# Patient Record
Sex: Female | Born: 1985
Health system: Southern US, Community
[De-identification: ages and names within clinical notes are randomized; demographics above are authoritative.]

## PROBLEM LIST (undated history)

## (undated) DIAGNOSIS — I10 Essential (primary) hypertension: Secondary | ICD-10-CM

## (undated) DIAGNOSIS — J45909 Unspecified asthma, uncomplicated: Secondary | ICD-10-CM

## (undated) DIAGNOSIS — E7800 Pure hypercholesterolemia, unspecified: Secondary | ICD-10-CM

## (undated) DIAGNOSIS — N83209 Unspecified ovarian cyst, unspecified side: Secondary | ICD-10-CM

## (undated) DIAGNOSIS — K219 Gastro-esophageal reflux disease without esophagitis: Secondary | ICD-10-CM

## (undated) DIAGNOSIS — I341 Nonrheumatic mitral (valve) prolapse: Secondary | ICD-10-CM

---

## 2014-12-24 DIAGNOSIS — F410 Panic disorder [episodic paroxysmal anxiety] without agoraphobia: Secondary | ICD-10-CM | POA: Insufficient documentation

## 2014-12-24 DIAGNOSIS — I341 Nonrheumatic mitral (valve) prolapse: Secondary | ICD-10-CM | POA: Insufficient documentation

## 2015-01-13 ENCOUNTER — Encounter (HOSPITAL_BASED_OUTPATIENT_CLINIC_OR_DEPARTMENT_OTHER): Payer: Self-pay | Admitting: Emergency Medicine

## 2015-01-13 ENCOUNTER — Emergency Department (HOSPITAL_BASED_OUTPATIENT_CLINIC_OR_DEPARTMENT_OTHER): Payer: BLUE CROSS/BLUE SHIELD

## 2015-01-13 ENCOUNTER — Emergency Department (HOSPITAL_BASED_OUTPATIENT_CLINIC_OR_DEPARTMENT_OTHER)
Admission: EM | Admit: 2015-01-13 | Discharge: 2015-01-13 | Disposition: A | Discharge status: Home / Self Care | Payer: BLUE CROSS/BLUE SHIELD | Attending: Emergency Medicine | Admitting: Emergency Medicine

## 2015-01-13 DIAGNOSIS — Y9289 Other specified places as the place of occurrence of the external cause: Secondary | ICD-10-CM | POA: Insufficient documentation

## 2015-01-13 DIAGNOSIS — Z79899 Other long term (current) drug therapy: Secondary | ICD-10-CM | POA: Insufficient documentation

## 2015-01-13 DIAGNOSIS — Y998 Other external cause status: Secondary | ICD-10-CM | POA: Insufficient documentation

## 2015-01-13 DIAGNOSIS — Y9389 Activity, other specified: Secondary | ICD-10-CM | POA: Insufficient documentation

## 2015-01-13 DIAGNOSIS — S8992XA Unspecified injury of left lower leg, initial encounter: Secondary | ICD-10-CM | POA: Diagnosis present

## 2015-01-13 DIAGNOSIS — S8002XA Contusion of left knee, initial encounter: Secondary | ICD-10-CM

## 2015-01-13 DIAGNOSIS — X58XXXA Exposure to other specified factors, initial encounter: Secondary | ICD-10-CM | POA: Insufficient documentation

## 2015-01-13 DIAGNOSIS — Z8679 Personal history of other diseases of the circulatory system: Secondary | ICD-10-CM | POA: Insufficient documentation

## 2015-01-13 HISTORY — DX: Nonrheumatic mitral (valve) prolapse: I34.1

## 2015-01-13 MED ORDER — HYDROCODONE-ACETAMINOPHEN 5-325 MG PO TABS: 1.0000 | ORAL_TABLET | ORAL | Status: DC | PRN

## 2015-01-13 MED ORDER — HYDROCODONE-ACETAMINOPHEN 5-325 MG PO TABS
1.0000 | ORAL_TABLET | Freq: Once | ORAL | Status: AC
  Administered 2015-01-13: 1 via ORAL
  Filled 2015-01-13: qty 1

## 2015-01-13 NOTE — Discharge Instructions (Signed)

## 2015-01-13 NOTE — ED Notes (Signed)
Pt twist knee moving furniture down steps no deformity noted

## 2015-01-13 NOTE — ED Notes (Signed)
[facility]. Pt alert, NAD, calm, interactive. C/o L knee pain after twisting knee, no meds PTA. denies other sx or injuries.

## 2015-01-13 NOTE — ED Provider Notes (Signed)
CSN: [id number]     Arrival date & time 01/13/15  2125 History   First MD Initiated Contact with Patient 01/13/15 2234     Chief Complaint  Patient presents with  . Knee Injury     (Consider location/radiation/quality/duration/timing/severity/associated sxs/prior Treatment) The history is provided by the patient.   patient presents with a left knee injury. States she was moving and stepped wrong on a stair. Began to fell forward and her lower leg twisted and she landed onto her knee. No other injuries. She did hit her head but no headache. No chest or abdominal pain. States she has had some pain walking on it since.  Past Medical History  Diagnosis Date  . Mitral valve prolapse    History reviewed. No pertinent past surgical history. History reviewed. No pertinent family history. History  Substance Use Topics  . Smoking status: Never Smoker   . Smokeless tobacco: Never Used  . Alcohol Use: No   OB History    No data available     Review of Systems  Constitutional: Negative for appetite change.  Respiratory: Negative for shortness of breath.   Gastrointestinal: Negative for abdominal pain.  Musculoskeletal: Positive for gait problem. Negative for back pain, joint swelling and neck pain.  Skin: Negative for wound.  Neurological: Negative for tremors and numbness.      Allergies  Codeine  Home Medications   Prior to Admission medications   Medication Sig Start Date End Date Taking? Authorizing Provider  busPIRone (BUSPAR) 15 MG tablet Take 15 mg by mouth 2 (two) times daily.   Yes Historical Provider, MD  HYDROcodone-acetaminophen (NORCO/VICODIN) 5-325 MG per tablet Take 1-2 tablets by mouth every 4 (four) hours as needed. 01/13/15   Juliet RudeNathan R. Ezekeil Bethel, MD   BP 106/69 mmHg  Pulse 78  Temp(Src) 98.1 F (36.7 C) (Oral)  Resp 18  SpO2 100%  LMP 01/06/2015 Physical Exam  Constitutional: She appears well-developed.  Cardiovascular: Normal rate and regular rhythm.    Pulmonary/Chest: She has no rales.  Abdominal: There is no tenderness.  Musculoskeletal: She exhibits tenderness.  Left knee tenderness laterally. Pain with flexion and extension. Pain with abduction and adduction at knee.   Skin: Skin is warm.    ED Course  Procedures (including critical care time) Labs Review Labs Reviewed - No data to display  Imaging Review Dg Knee Complete 4 Views Left  01/13/2015   CLINICAL DATA:  Twisting injury to left knee while moving furniture down steps. Left knee pain. Initial encounter.  EXAM: LEFT KNEE - COMPLETE 4+ VIEW  COMPARISON:  None.  FINDINGS: There is no evidence of fracture or dislocation. The joint spaces are preserved. No significant degenerative change is seen; the patellofemoral joint is grossly unremarkable in appearance. A fabella is noted.  No significant joint effusion is seen. The visualized soft tissues are normal in appearance.  IMPRESSION: No evidence of fracture or dislocation.   Electronically Signed   By: Roanna RaiderJeffery  Chang M.D.   On: 01/13/2015 23:03     EKG Interpretation None      MDM   Final diagnoses:  Knee contusion, left, initial encounter    Pain with likely internal derangement of left knee. Negative x-ray. Has pain. Will discharge home. [place]    Obediah Welles R. Rubin PayorPickering, MD 01/13/15 2328

## 2015-01-16 NOTE — ED Notes (Signed)
Patient called and stated that she can not schedule a follow up with Ortho, due to her medical insurance and PCP needs to make referral.  States she does not see the PCP listed on the card, and they are not taking new patients.  States she was initially offered to see Dr. Pearletha ForgeHudnall, and now would like to have a referral.  Info given for Dr. Pearletha ForgeHudnall, and encouraged to call Medicaid if she continues to have a problem with follow up.

## 2015-01-17 ENCOUNTER — Encounter: Payer: Self-pay | Admitting: Family Medicine

## 2015-01-17 ENCOUNTER — Ambulatory Visit (INDEPENDENT_AMBULATORY_CARE_PROVIDER_SITE_OTHER): Payer: BLUE CROSS/BLUE SHIELD | Admitting: Family Medicine

## 2015-01-17 VITALS — BP 128/74 | Ht 60.0 in | Wt 129.0 lb

## 2015-01-17 DIAGNOSIS — S8992XA Unspecified injury of left lower leg, initial encounter: Secondary | ICD-10-CM

## 2015-01-17 NOTE — Patient Instructions (Signed)
You have a patellar subluxation (near dislocation). Wear immobilizer when up and walking around - try to keep knee straight at all times for next 2 weeks. Ok to take this off to ice the area 15 minutes at a time 3-4 times a day. Ibuprofen or aleve as needed for pain and inflammation. Ok to take tylenol in addition to this. Do straight leg raises, quad flexion exercises I showed you 3 sets of 10 once a day - add ankle weight if these become too easy. Out of work for 2 weeks. Follow up with me in 2 weeks.

## 2015-01-22 DIAGNOSIS — F419 Anxiety disorder, unspecified: Secondary | ICD-10-CM | POA: Insufficient documentation

## 2015-01-22 DIAGNOSIS — J45909 Unspecified asthma, uncomplicated: Secondary | ICD-10-CM | POA: Insufficient documentation

## 2015-01-22 DIAGNOSIS — J302 Other seasonal allergic rhinitis: Secondary | ICD-10-CM | POA: Insufficient documentation

## 2015-01-22 DIAGNOSIS — S8992XA Unspecified injury of left lower leg, initial encounter: Secondary | ICD-10-CM | POA: Insufficient documentation

## 2015-01-22 NOTE — Progress Notes (Signed)
PCP: Agustina CaroliHICKS, KRISTIN D, MD  Subjective:   HPI: Patient is a 29 y.o. female here for left knee pain.  Patient reports on Sunday she was coming down stairs (in process of moving) when she twisted her left knee to the outside and bent behind her. Then landed onto left knee. Immediate pain mostly behind kneecap. Radiographs negative for fracture. No catching, locking. No prior injuries to this knee. Some swelling.   Past Medical History  Diagnosis Date  . Mitral valve prolapse     Current Outpatient Prescriptions on File Prior to Visit  Medication Sig Dispense Refill  . HYDROcodone-acetaminophen (NORCO/VICODIN) 5-325 MG per tablet Take 1-2 tablets by mouth every 4 (four) hours as needed. 8 tablet 0   No current facility-administered medications on file prior to visit.    No past surgical history on file.  Allergies  Allergen Reactions  . Latex Swelling and Hives  . Codeine   . Escitalopram Rash    Nausea, decreased appetite, felt like a zombie    History   Social History  . Marital Status: Single    Spouse Name: N/A  . Number of Children: N/A  . Years of Education: N/A   Occupational History  . Not on file.   Social History Main Topics  . Smoking status: Never Smoker   . Smokeless tobacco: Never Used  . Alcohol Use: No  . Drug Use: No  . Sexual Activity: Not on file   Other Topics Concern  . Not on file   Social History Narrative    No family history on file.  BP 128/74 mmHg  Ht 5' (1.524 m)  Wt 129 lb (58.514 kg)  BMI 25.19 kg/m2  LMP 01/06/2015  Review of Systems: See HPI above.    Objective:  Physical Exam:  Gen: NAD  Left knee: No gross deformity, ecchymoses, effusion. TTP post patellar facets.  No joing line tenderness. FROM. Negative ant/post drawers. Negative valgus/varus testing. Negative lachmanns. Negative mcmurrays, apleys.  Mild positive patellar apprehension. NV intact distally.    Assessment & Plan:  1. Left knee injury -  consistent with patellar subluxation though no dislocation.  Radiographs reassuring.  Other tests negative on exam.  Icing, nsaids, immobilizer next 2-3 weeks.  Start home quad strengthening exercises which were reviewed today.  Out of work for next 2 weeks - f/u at that time.

## 2015-01-22 NOTE — Assessment & Plan Note (Signed)
consistent with patellar subluxation though no dislocation.  Radiographs reassuring.  Other tests negative on exam.  Icing, nsaids, immobilizer next 2-3 weeks.  Start home quad strengthening exercises which were reviewed today.  Out of work for next 2 weeks - f/u at that time.

## 2015-01-24 ENCOUNTER — Telehealth: Payer: Self-pay | Admitting: Family Medicine

## 2015-01-24 NOTE — Telephone Encounter (Signed)
I think we need to see her back then - I'd squeeze her in the schedule tomorrow if she can come in then.

## 2015-01-25 ENCOUNTER — Encounter: Payer: Self-pay | Admitting: Family Medicine

## 2015-01-25 ENCOUNTER — Ambulatory Visit (INDEPENDENT_AMBULATORY_CARE_PROVIDER_SITE_OTHER): Payer: BLUE CROSS/BLUE SHIELD | Admitting: Family Medicine

## 2015-01-25 VITALS — BP 123/82 | HR 88 | Ht 60.0 in | Wt 129.0 lb

## 2015-01-25 DIAGNOSIS — S8992XD Unspecified injury of left lower leg, subsequent encounter: Secondary | ICD-10-CM

## 2015-01-25 NOTE — Patient Instructions (Signed)
Wear knee brace when up and walking around for next 4-5 weeks. Icing, tylenol and/or ibuprofen if needed. Consider physical therapy in future if needed. Follow up with me in 5 weeks or as needed.

## 2015-01-29 NOTE — Assessment & Plan Note (Signed)
consistent with patellar subluxation though no dislocation.  Has improved tremendously over the past week.  Switch to a knee brace and wear this for next 5 weeks when up and walking around.  Tylenol/nsaids, icing only if needed.  Continue home exercises for 5 weeks.  Consider physical therapy if not improving.  F/u prn.  Return to work without restrictions otherwise.

## 2015-01-29 NOTE — Progress Notes (Signed)
PCP: Agustina CaroliHICKS, KRISTIN D, MD  Subjective:   HPI: Patient is a 29 y.o. female here for left knee pain.  3/3: Patient reports on Sunday she was coming down stairs (in process of moving) when she twisted her left knee to the outside and bent behind her. Then landed onto left knee. Immediate pain mostly behind kneecap. Radiographs negative for fracture. No catching, locking. No prior injuries to this knee. Some swelling.   3/11: Patient feels much better since last visit. Has been out of work. Stopped using immobilizer. Doing home exercises as directed. Not taking anything for pain now.  Past Medical History  Diagnosis Date  . Mitral valve prolapse     Current Outpatient Prescriptions on File Prior to Visit  Medication Sig Dispense Refill  . albuterol (PROVENTIL) (5 MG/ML) 0.5% nebulizer solution Inhale by nebulization. Dilute 0.9025ml=1.25mg  to final volume of 3ml for inhalation every 3-4 hours as needed for wheezing    . busPIRone (BUSPAR) 15 MG tablet Take by mouth one half tab twice a day for 7 days, then increase to one whole tab twice a day-anxiety    . diclofenac (CATAFLAM) 50 MG tablet Take 50 mg by mouth.    Marland Kitchen. HYDROcodone-acetaminophen (NORCO/VICODIN) 5-325 MG per tablet Take 1-2 tablets by mouth every 4 (four) hours as needed. 8 tablet 0  . norethindrone (MICRONOR,CAMILA,ERRIN) 0.35 MG tablet Take by mouth.    . rizatriptan (MAXALT) 10 MG tablet Take 1 tablet at onset of headache. May repeat one time in 2 hours if needed. Max of 2/24 hours.    . sertraline (ZOLOFT) 25 MG tablet Take 50 mg by mouth.     No current facility-administered medications on file prior to visit.    No past surgical history on file.  Allergies  Allergen Reactions  . Latex Swelling and Hives  . Codeine   . Escitalopram Rash    Nausea, decreased appetite, felt like a zombie    History   Social History  . Marital Status: Single    Spouse Name: N/A  . Number of Children: N/A  . Years of  Education: N/A   Occupational History  . Not on file.   Social History Main Topics  . Smoking status: Never Smoker   . Smokeless tobacco: Never Used  . Alcohol Use: No  . Drug Use: No  . Sexual Activity: Not on file   Other Topics Concern  . Not on file   Social History Narrative    No family history on file.  BP 123/82 mmHg  Pulse 88  Ht 5' (1.524 m)  Wt 129 lb (58.514 kg)  BMI 25.19 kg/m2  LMP 01/06/2015  Review of Systems: See HPI above.    Objective:  Physical Exam:  Gen: NAD  Left knee: No gross deformity, ecchymoses, effusion. No TTP post patellar facets.  No joint line tenderness. FROM. Negative ant/post drawers. Negative valgus/varus testing. Negative lachmanns. Negative mcmurrays, apleys. Negative patellar apprehension. NV intact distally.    Assessment & Plan:  1. Left knee injury - consistent with patellar subluxation though no dislocation.  Has improved tremendously over the past week.  Switch to a knee brace and wear this for next 5 weeks when up and walking around.  Tylenol/nsaids, icing only if needed.  Continue home exercises for 5 weeks.  Consider physical therapy if not improving.  F/u prn.  Return to work without restrictions otherwise.

## 2015-01-30 ENCOUNTER — Ambulatory Visit: Payer: BLUE CROSS/BLUE SHIELD | Admitting: Family Medicine

## 2015-02-02 ENCOUNTER — Emergency Department (HOSPITAL_BASED_OUTPATIENT_CLINIC_OR_DEPARTMENT_OTHER)
Admission: EM | Admit: 2015-02-02 | Discharge: 2015-02-03 | Disposition: A | Discharge status: Home / Self Care | Payer: BLUE CROSS/BLUE SHIELD | Attending: Emergency Medicine | Admitting: Emergency Medicine

## 2015-02-02 ENCOUNTER — Encounter (HOSPITAL_BASED_OUTPATIENT_CLINIC_OR_DEPARTMENT_OTHER): Payer: Self-pay | Admitting: Emergency Medicine

## 2015-02-02 DIAGNOSIS — Z79899 Other long term (current) drug therapy: Secondary | ICD-10-CM | POA: Diagnosis not present

## 2015-02-02 DIAGNOSIS — M549 Dorsalgia, unspecified: Secondary | ICD-10-CM | POA: Insufficient documentation

## 2015-02-02 DIAGNOSIS — R141 Gas pain: Secondary | ICD-10-CM | POA: Diagnosis not present

## 2015-02-02 DIAGNOSIS — R110 Nausea: Secondary | ICD-10-CM | POA: Insufficient documentation

## 2015-02-02 DIAGNOSIS — Z3202 Encounter for pregnancy test, result negative: Secondary | ICD-10-CM | POA: Diagnosis not present

## 2015-02-02 DIAGNOSIS — R1084 Generalized abdominal pain: Secondary | ICD-10-CM | POA: Insufficient documentation

## 2015-02-02 DIAGNOSIS — R52 Pain, unspecified: Secondary | ICD-10-CM

## 2015-02-02 DIAGNOSIS — M791 Myalgia, unspecified site: Secondary | ICD-10-CM

## 2015-02-02 DIAGNOSIS — Z791 Long term (current) use of non-steroidal anti-inflammatories (NSAID): Secondary | ICD-10-CM | POA: Diagnosis not present

## 2015-02-02 DIAGNOSIS — IMO0001 Reserved for inherently not codable concepts without codable children: Secondary | ICD-10-CM

## 2015-02-02 LAB — URINALYSIS, ROUTINE W REFLEX MICROSCOPIC
Glucose, UA: NEGATIVE mg/dL
HGB URINE DIPSTICK: NEGATIVE
Ketones, ur: NEGATIVE mg/dL
Leukocytes, UA: NEGATIVE
Nitrite: NEGATIVE
PROTEIN: NEGATIVE mg/dL
SPECIFIC GRAVITY, URINE: 1.026 (ref 1.005–1.030)
Urobilinogen, UA: 2 mg/dL — ABNORMAL HIGH (ref 0.0–1.0)
pH: 6.5 (ref 5.0–8.0)

## 2015-02-02 LAB — PREGNANCY, URINE: PREG TEST UR: NEGATIVE

## 2015-02-02 MED ORDER — KETOROLAC TROMETHAMINE 30 MG/ML IJ SOLN
30.0000 mg | Freq: Once | INTRAMUSCULAR | Status: AC
  Administered 2015-02-03: 30 mg via INTRAVENOUS
  Filled 2015-02-02: qty 1

## 2015-02-02 MED ORDER — DICYCLOMINE HCL 10 MG/ML IM SOLN
20.0000 mg | Freq: Once | INTRAMUSCULAR | Status: AC
  Administered 2015-02-03: 20 mg via INTRAMUSCULAR
  Filled 2015-02-02: qty 2

## 2015-02-02 NOTE — ED Notes (Signed)
Pt reports severe abd pain onset today @ 1600 reports Hx ovarian cycst

## 2015-02-02 NOTE — ED Provider Notes (Signed)
CSN: [id number]     Arrival date & time 02/02/15  2006 History  This chart was scribed for Elida Harbin, MD by Tonye RoyaltyJoshua Chen, ED Scribe. This patient was seen in room MH11/MH11 and the patient's care was started at 11:03 PM.    Chief Complaint  Patient presents with  . Abdominal Pain   Patient is a 29 y.o. female presenting with abdominal pain. The history is provided by the patient. No language interpreter was used.  Abdominal Pain Pain location:  Generalized Pain quality: stabbing   Pain radiation: low back. Pain severity:  Moderate Onset quality:  Sudden Duration:  1 day Timing:  Constant Progression:  Unchanged Chronicity:  New Context: sick contacts   Context: not suspicious food intake and not trauma   Relieved by:  None tried Worsened by:  Nothing tried Ineffective treatments:  None tried Associated symptoms: nausea   Associated symptoms: no constipation, no cough, no diarrhea, no dysuria, no vaginal bleeding, no vaginal discharge and no vomiting   Risk factors: no alcohol abuse and not pregnant     HPI Comments: Linda SitesKristina Archer is a 29 y.o. female with history of ovarian cyst who presents to the Emergency Department complaining of constant, stabbing low back pain radiating to front with onset this morning. She reports associated nausea and smelly, dark urine. Her daughter has stomach flu. She states her menstrual cycle just ended. She denies atypical bleeding or discharge. No BM today. She denies diarrhea, constitpation, vomiting, dysuria, frequency, cough, congestion, rash. She denies any new medications.  Past Medical History  Diagnosis Date  . Mitral valve prolapse    History reviewed. No pertinent past surgical history. History reviewed. No pertinent family history. History  Substance Use Topics  . Smoking status: Never Smoker   . Smokeless tobacco: Never Used  . Alcohol Use: No   OB History    No data available     Review of Systems  HENT: Negative for  congestion.   Respiratory: Negative for cough.   Gastrointestinal: Positive for nausea and abdominal pain. Negative for vomiting, diarrhea and constipation.  Genitourinary: Negative for dysuria, frequency, vaginal bleeding and vaginal discharge.  Musculoskeletal: Positive for back pain.  Skin: Negative for rash.  All other systems reviewed and are negative.     Allergies  Latex; Codeine; and Escitalopram  Home Medications   Prior to Admission medications   Medication Sig Start Date End Date Taking? Authorizing Provider  albuterol (PROVENTIL) (5 MG/ML) 0.5% nebulizer solution Inhale by nebulization. Dilute 0.8425ml=1.25mg  to final volume of 3ml for inhalation every 3-4 hours as needed for wheezing    Historical Provider, MD  busPIRone (BUSPAR) 15 MG tablet Take by mouth one half tab twice a day for 7 days, then increase to one whole tab twice a day-anxiety 01/08/15 01/09/16  Historical Provider, MD  diclofenac (CATAFLAM) 50 MG tablet Take 50 mg by mouth. 06/19/14   Historical Provider, MD  HYDROcodone-acetaminophen (NORCO/VICODIN) 5-325 MG per tablet Take 1-2 tablets by mouth every 4 (four) hours as needed. 01/13/15   Benjiman CoreNathan Pickering, MD  norethindrone (MICRONOR,CAMILA,ERRIN) 0.35 MG tablet Take by mouth.    Historical Provider, MD  rizatriptan (MAXALT) 10 MG tablet Take 1 tablet at onset of headache. May repeat one time in 2 hours if needed. Max of 2/24 hours. 10/15/14   Historical Provider, MD  sertraline (ZOLOFT) 25 MG tablet Take 50 mg by mouth.    Historical Provider, MD   BP 109/71 mmHg  Pulse 81  Temp(Src)  99.3 F (37.4 C) (Oral)  Resp 24  Ht 5' (1.524 m)  Wt 129 lb (58.514 kg)  BMI 25.19 kg/m2  SpO2 100%  LMP 01/03/2015 Physical Exam  Constitutional: She is oriented to person, place, and time. She appears well-developed and well-nourished. No distress.  HENT:  Head: Normocephalic and atraumatic.  Mouth/Throat: Oropharynx is clear and moist. No oropharyngeal exudate.  Moist  mucous membranes  Eyes: Conjunctivae and EOM are normal. Pupils are equal, round, and reactive to light.  Neck: Normal range of motion. Neck supple. No tracheal deviation present.  Cardiovascular: Normal rate, regular rhythm and normal heart sounds.   No murmur heard. Pulmonary/Chest: Effort normal and breath sounds normal. No respiratory distress. She has no wheezes. She has no rales.  Abdominal: Soft. She exhibits no distension. There is no tenderness. There is no rigidity, no rebound, no guarding, no tenderness at McBurney's point and negative Murphy's sign.  Hyperactive bowel sounds reactive   Musculoskeletal: Normal range of motion. She exhibits no edema.  Neurological: She is alert and oriented to person, place, and time.  Skin: Skin is warm and dry. She is not diaphoretic.  Psychiatric: She has a normal mood and affect.  Nursing note and vitals reviewed.   ED Course  Procedures (including critical care time)  DIAGNOSTIC STUDIES: Oxygen Saturation is 100% on room air, normal by my interpretation.    COORDINATION OF CARE: 11:07 PM Discussed treatment plan with patient at beside, the patient agrees with the plan and has no further questions at this time.   Labs Review Labs Reviewed - No data to display  Imaging Review No results found.   EKG Interpretation None      MDM   Final diagnoses:  None    Likely muscle spasm of the lower back and also has increased gas and spasm.  She has no white count benign exam and negative CT scan will treat for gas and cramping and MSK pain.  Strict return precautions given  I personally performed the services described in this documentation, which was scribed in my presence. The recorded information has been reviewed and is accurate.    Cy Blamer, MD 02/03/15 [phone]

## 2015-02-03 ENCOUNTER — Encounter (HOSPITAL_BASED_OUTPATIENT_CLINIC_OR_DEPARTMENT_OTHER): Payer: Self-pay | Admitting: Emergency Medicine

## 2015-02-03 ENCOUNTER — Emergency Department (HOSPITAL_BASED_OUTPATIENT_CLINIC_OR_DEPARTMENT_OTHER): Payer: BLUE CROSS/BLUE SHIELD

## 2015-02-03 DIAGNOSIS — R1084 Generalized abdominal pain: Secondary | ICD-10-CM | POA: Diagnosis not present

## 2015-02-03 LAB — CBC WITH DIFFERENTIAL/PLATELET
BASOS PCT: 0 % (ref 0–1)
Basophils Absolute: 0 10*3/uL (ref 0.0–0.1)
EOS PCT: 1 % (ref 0–5)
Eosinophils Absolute: 0.1 10*3/uL (ref 0.0–0.7)
HCT: 40.9 % (ref 36.0–46.0)
HEMOGLOBIN: 13.4 g/dL (ref 12.0–15.0)
LYMPHS ABS: 0.7 10*3/uL (ref 0.7–4.0)
LYMPHS PCT: 12 % (ref 12–46)
MCH: 25.8 pg — ABNORMAL LOW (ref 26.0–34.0)
MCHC: 32.8 g/dL (ref 30.0–36.0)
MCV: 78.8 fL (ref 78.0–100.0)
MONOS PCT: 7 % (ref 3–12)
Monocytes Absolute: 0.4 10*3/uL (ref 0.1–1.0)
NEUTROS PCT: 80 % — AB (ref 43–77)
Neutro Abs: 4.6 10*3/uL (ref 1.7–7.7)
Platelets: 165 10*3/uL (ref 150–400)
RBC: 5.19 MIL/uL — AB (ref 3.87–5.11)
RDW: 13.7 % (ref 11.5–15.5)
WBC: 5.8 10*3/uL (ref 4.0–10.5)

## 2015-02-03 LAB — COMPREHENSIVE METABOLIC PANEL
ALBUMIN: 3.9 g/dL (ref 3.5–5.2)
ALK PHOS: 66 U/L (ref 39–117)
ALT: 9 U/L (ref 0–35)
ANION GAP: 7 (ref 5–15)
AST: 15 U/L (ref 0–37)
BILIRUBIN TOTAL: 0.8 mg/dL (ref 0.3–1.2)
BUN: 11 mg/dL (ref 6–23)
CO2: 25 mmol/L (ref 19–32)
CREATININE: 0.69 mg/dL (ref 0.50–1.10)
Calcium: 8.5 mg/dL (ref 8.4–10.5)
Chloride: 105 mmol/L (ref 96–112)
GFR calc Af Amer: >90 mL/min (ref >90)
GFR calc non Af Amer: >90 mL/min (ref >90)
Glucose, Bld: 96 mg/dL (ref 70–99)
Potassium: 3.3 mmol/L — ABNORMAL LOW (ref 3.5–5.1)
SODIUM: 137 mmol/L (ref 135–145)
TOTAL PROTEIN: 6.9 g/dL (ref 6.0–8.3)

## 2015-02-03 MED ORDER — MORPHINE SULFATE 4 MG/ML IJ SOLN
4.0000 mg | Freq: Once | INTRAMUSCULAR | Status: AC
  Administered 2015-02-03: 4 mg via INTRAVENOUS
  Filled 2015-02-03: qty 1

## 2015-02-03 MED ORDER — DICYCLOMINE HCL 20 MG PO TABS: 20.0000 mg | ORAL_TABLET | Freq: Two times a day (BID) | ORAL | Status: DC

## 2015-02-03 MED ORDER — GI COCKTAIL ~~LOC~~: 30.0000 mL | Freq: Once | ORAL | Status: DC

## 2015-02-03 MED ORDER — METHOCARBAMOL 500 MG PO TABS: 500.0000 mg | ORAL_TABLET | Freq: Two times a day (BID) | ORAL | Status: DC

## 2015-02-03 MED ORDER — DICLOFENAC POTASSIUM 50 MG PO TABS: 50.0000 mg | ORAL_TABLET | Freq: Three times a day (TID) | ORAL | Status: DC

## 2015-09-20 DIAGNOSIS — D696 Thrombocytopenia, unspecified: Secondary | ICD-10-CM | POA: Insufficient documentation

## 2015-12-13 ENCOUNTER — Ambulatory Visit (INDEPENDENT_AMBULATORY_CARE_PROVIDER_SITE_OTHER): Payer: Managed Care, Other (non HMO) | Admitting: Family Medicine

## 2015-12-13 DIAGNOSIS — S8992XD Unspecified injury of left lower leg, subsequent encounter: Secondary | ICD-10-CM

## 2015-12-13 MED ORDER — ACETAMINOPHEN-CODEINE #3 300-30 MG PO TABS
1.0000 | ORAL_TABLET | Freq: Four times a day (QID) | ORAL | Status: DC | PRN
Start: 2015-12-13 — End: 2015-12-31

## 2015-12-13 NOTE — Patient Instructions (Addendum)
You have subluxed your patella again but it doesn't appear that you've done damage to the knee itself. Ice the knee 15 minutes at a time 3-4 times a day. Wear knee brace when up and walking around until I see you back. Ibuprofen  three times a day OR aleve 2 tabs twice a day with food for pain and inflammation. Elevate above your heart as needed for swelling. Do simple motion exercises Tylenol #3 as needed for severe pain. Follow up with me in 2 weeks for reevaluation.

## 2015-12-17 NOTE — Assessment & Plan Note (Signed)
consistent with patellar subluxation though no dislocation.  Reassured patient.  Icing, knee brace, nsaids, elevation.  Tylenol #3 for severe pain.  F/u in 2 weeks for reevaluation.

## 2015-12-17 NOTE — Progress Notes (Signed)
PCP: Agustina Caroli, MD  Subjective:   HPI: Patient is a 30 y.o. female here for left knee injury.  3/3: Patient reports on Sunday she was coming down stairs (in process of moving) when she twisted her left knee to the outside and bent behind her. Then landed onto left knee. Immediate pain mostly behind kneecap. Radiographs negative for fracture. No catching, locking. No prior injuries to this knee. Some swelling.   01/25/15: Patient feels much better since last visit. Has been out of work. Stopped using immobilizer. Doing home exercises as directed. Not taking anything for pain now.  12/13/15: Patient reports on Saturday she was stepping through the net walk at [facility] when she felt her left knee shift a little bit. + swelling and bruising. Pain has improved down to 3/10 level now. Started wearing her knee brace again. Pain is dull, anterior. No skin changes, fever, other complaints.  Past Medical History  Diagnosis Date  . Mitral valve prolapse     Current Outpatient Prescriptions on File Prior to Visit  Medication Sig Dispense Refill  . albuterol (PROVENTIL) (5 MG/ML) 0.5% nebulizer solution Inhale by nebulization. Dilute 0.71ml=1.25mg  to final volume of 3ml for inhalation every 3-4 hours as needed for wheezing    . norethindrone (MICRONOR,CAMILA,ERRIN) 0.35 MG tablet Take by mouth.    . rizatriptan (MAXALT) 10 MG tablet Take 1 tablet at onset of headache. May repeat one time in 2 hours if needed. Max of 2/24 hours.     No current facility-administered medications on file prior to visit.    No past surgical history on file.  Allergies  Allergen Reactions  . Latex Swelling and Hives  . Codeine   . Escitalopram Rash    Nausea, decreased appetite, felt like a zombie    Social History   Social History  . Marital Status: Single    Spouse Name: N/A  . Number of Children: N/A  . Years of Education: N/A   Occupational History  . Not on file.   Social  History Main Topics  . Smoking status: Never Smoker   . Smokeless tobacco: Never Used  . Alcohol Use: No  . Drug Use: No  . Sexual Activity: Not on file   Other Topics Concern  . Not on file   Social History Narrative    No family history on file.  There were no vitals taken for this visit.  Review of Systems: See HPI above.    Objective:  Physical Exam:  Gen: NAD  Left knee: No gross deformity, ecchymoses, effusion. TTP post patellar facets.  No joint line tenderness. ROM 0-90 degrees. Negative ant/post drawers. Negative valgus/varus testing. Negative lachmanns. Negative mcmurrays, apleys. Mild pain patellar apprehension. NV intact distally.  Right knee: FROM without pain.    Assessment & Plan:  1. Left knee injury - consistent with patellar subluxation though no dislocation.  Reassured patient.  Icing, knee brace, nsaids, elevation.  Tylenol #3 for severe pain.  F/u in 2 weeks for reevaluation.

## 2015-12-27 ENCOUNTER — Encounter: Payer: Self-pay | Admitting: Family Medicine

## 2015-12-27 ENCOUNTER — Ambulatory Visit (INDEPENDENT_AMBULATORY_CARE_PROVIDER_SITE_OTHER): Payer: Medicaid Other | Admitting: Family Medicine

## 2015-12-27 VITALS — BP 121/77 | HR 83 | Ht 60.0 in | Wt 135.0 lb

## 2015-12-27 DIAGNOSIS — S8992XD Unspecified injury of left lower leg, subsequent encounter: Secondary | ICD-10-CM

## 2015-12-27 NOTE — Patient Instructions (Signed)
Start home exercises - knee extensions, straight leg raises, straight leg raises with foot turned outwards 3 sets of 10 once a day for 4 weeks. Add ankle weight if these become too easy. Follow up with me as needed.

## 2015-12-31 NOTE — Progress Notes (Signed)
PCP: Agustina Caroli, MD  Subjective:   HPI: Patient is a 30 y.o. female here for left knee injury.  3/3: Patient reports on Sunday she was coming down stairs (in process of moving) when she twisted her left knee to the outside and bent behind her. Then landed onto left knee. Immediate pain mostly behind kneecap. Radiographs negative for fracture. No catching, locking. No prior injuries to this knee. Some swelling.   01/25/15: Patient feels much better since last visit. Has been out of work. Stopped using immobilizer. Doing home exercises as directed. Not taking anything for pain now.  12/13/15: Patient reports on Saturday she was stepping through the net walk at [employer] when she felt her left knee shift a little bit. + swelling and bruising. Pain has improved down to 3/10 level now. Started wearing her knee brace again. Pain is dull, anterior. No skin changes, fever, other complaints.  2/10: Patient reports she feels significantly improved. Pain level down to a 1/10 ache anteriorly. No longer wearing brace, taking any medicine or icing. Not worse with any particular activities. No skin changes, fever, other complaints.  Past Medical History  Diagnosis Date  . Mitral valve prolapse     Current Outpatient Prescriptions on File Prior to Visit  Medication Sig Dispense Refill  . albuterol (PROVENTIL) (5 MG/ML) 0.5% nebulizer solution Inhale by nebulization. Dilute 0.63ml=1.25mg  to final volume of 3ml for inhalation every 3-4 hours as needed for wheezing    . norethindrone (MICRONOR,CAMILA,ERRIN) 0.35 MG tablet Take by mouth.    . rizatriptan (MAXALT) 10 MG tablet Take 1 tablet at onset of headache. May repeat one time in 2 hours if needed. Max of 2/24 hours.    . sertraline (ZOLOFT) 50 MG tablet Take I mouth 1 tablet daily for 7 days, then increase to 1-1/2 tablets daily-anxiety     No current facility-administered medications on file prior to visit.    No past  surgical history on file.  Allergies  Allergen Reactions  . Latex Swelling and Hives  . Codeine   . Escitalopram Rash    Nausea, decreased appetite, felt like a zombie    Social History   Social History  . Marital Status: Single    Spouse Name: N/A  . Number of Children: N/A  . Years of Education: N/A   Occupational History  . Not on file.   Social History Main Topics  . Smoking status: Never Smoker   . Smokeless tobacco: Never Used  . Alcohol Use: No  . Drug Use: No  . Sexual Activity: Not on file   Other Topics Concern  . Not on file   Social History Narrative    No family history on file.  BP 121/77 mmHg  Pulse 83  Ht 5' (1.524 m)  Wt 135 lb (61.236 kg)  BMI 26.37 kg/m2  Review of Systems: See HPI above.    Objective:  Physical Exam:  Gen: NAD  Left knee: No gross deformity, ecchymoses, effusion. No TTP post patellar facets.  No joint line tenderness. FROM Negative ant/post drawers. Negative valgus/varus testing. Negative lachmanns. Negative mcmurrays, apleys. Negative patellar apprehension. NV intact distally.  Right knee: FROM without pain.    Assessment & Plan:  1. Left knee injury - consistent with patellar subluxation though no dislocation.  Clinically improved at this point.  Shown home exercises to do daily.  Brace only if needed.  F/u prn.

## 2015-12-31 NOTE — Assessment & Plan Note (Signed)
consistent with patellar subluxation though no dislocation.  Clinically improved at this point.  Shown home exercises to do daily.  Brace only if needed.  F/u prn.

## 2016-06-18 ENCOUNTER — Emergency Department (HOSPITAL_BASED_OUTPATIENT_CLINIC_OR_DEPARTMENT_OTHER)
Admission: EM | Admit: 2016-06-18 | Discharge: 2016-06-18 | Disposition: A | Discharge status: Home / Self Care | Payer: Managed Care, Other (non HMO) | Attending: Physician Assistant | Admitting: Physician Assistant

## 2016-06-18 ENCOUNTER — Encounter (HOSPITAL_BASED_OUTPATIENT_CLINIC_OR_DEPARTMENT_OTHER): Payer: Self-pay | Admitting: Emergency Medicine

## 2016-06-18 DIAGNOSIS — I10 Essential (primary) hypertension: Secondary | ICD-10-CM | POA: Diagnosis not present

## 2016-06-18 DIAGNOSIS — G5601 Carpal tunnel syndrome, right upper limb: Secondary | ICD-10-CM | POA: Diagnosis not present

## 2016-06-18 DIAGNOSIS — M79641 Pain in right hand: Secondary | ICD-10-CM | POA: Diagnosis present

## 2016-06-18 DIAGNOSIS — Z79899 Other long term (current) drug therapy: Secondary | ICD-10-CM | POA: Diagnosis not present

## 2016-06-18 HISTORY — DX: Essential (primary) hypertension: I10

## 2016-06-18 HISTORY — DX: Gastro-esophageal reflux disease without esophagitis: K21.9

## 2016-06-18 HISTORY — DX: Pure hypercholesterolemia, unspecified: E78.00

## 2016-06-18 MED ORDER — IBUPROFEN 800 MG PO TABS: 800.0000 mg | ORAL_TABLET | Freq: Three times a day (TID) | ORAL | 0 refills | Status: DC

## 2016-06-18 NOTE — ED Triage Notes (Signed)
Right hand with mild swelling. Pt denies injury but states she constantly uses hands for typing. Skin intact as well as sensation. Reports difficulty squeezing fist. Hx of carpal tunnel in the same extremity.

## 2016-06-18 NOTE — ED Provider Notes (Signed)
[facility]   CSN: [id number] Arrival date & time: 06/18/16  0902  First Provider Contact:  None       History   Chief Complaint Chief Complaint  Patient presents with  . Hand Pain    HPI Linda Archer is a 30 y.o. female.  Patient with a history of Carpal Tunnel presents today with pain of her right hand and right wrist.  She reports that the pain has been present for the past month and gradually worsening.  No acute injury or trauma.  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  Pain is worse with typing.  She describes the pain as a shooting pain from her wrist to her 1st, 2nd, and 3rd fingers.  She also reports associated numbness of the 1st-3rd fingers.  She has taken Ibuprofen for the pain with temporary relief.  She denies fever, chills, or any other symptoms.        Past Medical History:  Diagnosis Date  . GERD (gastroesophageal reflux disease)   . Hypercholesteremia   . Hypertension   . Mitral valve prolapse     Patient Active Problem List   Diagnosis Date Noted  . Thrombocytopenia (HCC) 09/20/2015  . 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 Anxiety 01/22/2015  . Allergic rhinitis, seasonal 01/22/2015  . Left knee injury 01/22/2015  . Billowing mitral valve 12/24/2014  . 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 No pertinent surgical history.  OB History    Gravida Para Term Preterm AB Living   2 2       2    SAB TAB Ectopic Multiple Live Births                   Home Medications    Prior to Admission medications   Medication Sig Start Date End Date Taking? Authorizing Provider  albuterol (PROVENTIL) (5 MG/ML) 0.5% nebulizer solution Inhale by nebulization. Dilute 0.36ml=1.25mg  to final volume of 12ml for inhalation every 3-4 hours as needed for wheezing   Yes Historical Provider, MD  citalopram (CELEXA) 40 MG tablet Take 40 mg by mouth  daily.   Yes Historical Provider, MD  azithromycin (ZITHROMAX) 250 MG tablet  12/25/15   Historical Provider, MD  HYDROcodone-homatropine (HYCODAN) 5-1.5 MG/5ML syrup Take by mouth. 12/25/15   Historical Provider, MD  norethindrone (MICRONOR,CAMILA,ERRIN) 0.35 MG tablet Take by mouth.    Historical Provider, MD  rizatriptan (MAXALT) 10 MG tablet Take 1 tablet at onset of headache. May repeat one time in 2 hours if needed. Max of 2/24 hours. 10/15/14   Historical Provider, MD  sertraline (ZOLOFT) 50 MG tablet Take I mouth 1 tablet daily for 7 days, then increase to 1-1/2 tablets daily-anxiety 09/23/15 09/22/16  Historical Provider, MD    Family History No family history on file.  Social History Social History  Substance Use Topics  . Smoking status: Never Smoker  . Smokeless tobacco: Never Used  . Alcohol use 0.0 oz/week     Comment: occaisional      Allergies   Latex; Codeine; and Escitalopram   Review of Systems Review of Systems  All other systems reviewed and are negative.    Physical Exam Updated Vital Signs BP 122/85 (BP Location: Left Arm)   Pulse 76   Temp 98.1 F (36.7 C) (Oral)   Resp 18   Ht 5' (1.524 m)  Wt 65.8 kg   LMP 06/12/2016   SpO2 99%   BMI 28.32 kg/m   Physical Exam  Constitutional: She appears well-developed and well-nourished.  Neck: Normal range of motion. Neck supple.  Cardiovascular: Normal rate and regular rhythm.   Pulses:      Radial pulses are 2+ on the right side, and 2+ on the left side.  Pulmonary/Chest: Effort normal and breath sounds normal.  Musculoskeletal:  No erythema, edema, or warmth of the right wrist or right hand Normal ROM of the right hand and the right wrist. Positive Tinel's sign and Phalan's sign  Neurological: She is alert.  Distal sensation of the fingers of the right hand intact  Skin: Skin is warm and dry.  Psychiatric: She has a normal mood and affect.  Nursing note and vitals reviewed.    ED Treatments /  Results  Labs (all labs ordered are listed, but only abnormal results are displayed) Labs Reviewed - No data to display  EKG  EKG Interpretation None       Radiology No results found.  Procedures Procedures (including critical care time)  Medications Ordered in ED Medications - No data to display   Initial Impression / Assessment and Plan / ED Course  I have reviewed the triage vital signs and the nursing notes.  Pertinent labs & imaging results that were available during my care of the patient were reviewed by me and considered in my medical decision making (see chart for details).  Clinical Course   Patient with pain of the right wrist and right hand.  No acute injury or trauma.  No signs of infection on exam.  Neurovascularly intact.  She does have positive Phalan's sign and Tinel's sign consistent with Carpal Tunnel.  Patient given wrist splint and instructed to take NSAIDs.  Stable for discharge.  Return precautions given.  Final Clinical Impressions(s) / ED Diagnoses   Final diagnoses:  None    New Prescriptions New Prescriptions   No medications on file     Santiago Glad, PA-C 06/18/16 1120    Courteney Lyn Mackuen, MD 06/18/16 1148

## 2016-06-18 NOTE — ED Notes (Signed)
Attempted to splint pt. However the splinting devices are not size appropriate for patients hand. Pt wishes to find a splint on her own.

## 2017-01-18 ENCOUNTER — Emergency Department (HOSPITAL_BASED_OUTPATIENT_CLINIC_OR_DEPARTMENT_OTHER): Payer: Managed Care, Other (non HMO)

## 2017-01-18 ENCOUNTER — Encounter (HOSPITAL_BASED_OUTPATIENT_CLINIC_OR_DEPARTMENT_OTHER): Payer: Self-pay

## 2017-01-18 ENCOUNTER — Emergency Department (HOSPITAL_BASED_OUTPATIENT_CLINIC_OR_DEPARTMENT_OTHER)
Admission: EM | Admit: 2017-01-18 | Discharge: 2017-01-19 | Disposition: A | Discharge status: Home / Self Care | Payer: Managed Care, Other (non HMO) | Attending: Emergency Medicine | Admitting: Emergency Medicine

## 2017-01-18 DIAGNOSIS — R109 Unspecified abdominal pain: Secondary | ICD-10-CM | POA: Diagnosis present

## 2017-01-18 DIAGNOSIS — Z79899 Other long term (current) drug therapy: Secondary | ICD-10-CM | POA: Insufficient documentation

## 2017-01-18 DIAGNOSIS — I10 Essential (primary) hypertension: Secondary | ICD-10-CM | POA: Diagnosis not present

## 2017-01-18 DIAGNOSIS — K5900 Constipation, unspecified: Secondary | ICD-10-CM | POA: Diagnosis not present

## 2017-01-18 DIAGNOSIS — N83511 Torsion of right ovary and ovarian pedicle: Secondary | ICD-10-CM | POA: Insufficient documentation

## 2017-01-18 DIAGNOSIS — N83519 Torsion of ovary and ovarian pedicle, unspecified side: Secondary | ICD-10-CM

## 2017-01-18 DIAGNOSIS — N83201 Unspecified ovarian cyst, right side: Secondary | ICD-10-CM | POA: Insufficient documentation

## 2017-01-18 LAB — CBC WITH DIFFERENTIAL/PLATELET
Basophils Absolute: 0 10*3/uL (ref 0.0–0.1)
Basophils Relative: 0 %
Eosinophils Absolute: 0.1 10*3/uL (ref 0.0–0.7)
Eosinophils Relative: 1 %
HCT: 41.8 % (ref 36.0–46.0)
Hemoglobin: 13.8 g/dL (ref 12.0–15.0)
Lymphocytes Relative: 17 %
Lymphs Abs: 2.1 10*3/uL (ref 0.7–4.0)
MCH: 26.4 pg (ref 26.0–34.0)
MCHC: 33 g/dL (ref 30.0–36.0)
MCV: 80.1 fL (ref 78.0–100.0)
Monocytes Absolute: 0.6 10*3/uL (ref 0.1–1.0)
Monocytes Relative: 5 %
Neutro Abs: 9.6 10*3/uL — ABNORMAL HIGH (ref 1.7–7.7)
Neutrophils Relative %: 77 %
Platelets: 236 10*3/uL (ref 150–400)
RBC: 5.22 MIL/uL — ABNORMAL HIGH (ref 3.87–5.11)
RDW: 13.5 % (ref 11.5–15.5)
WBC: 12.4 10*3/uL — ABNORMAL HIGH (ref 4.0–10.5)

## 2017-01-18 LAB — COMPREHENSIVE METABOLIC PANEL
ALBUMIN: 4.2 g/dL (ref 3.5–5.0)
ALK PHOS: 72 U/L (ref 38–126)
ALT: 12 U/L — ABNORMAL LOW (ref 14–54)
AST: 20 U/L (ref 15–41)
Anion gap: 7 (ref 5–15)
BILIRUBIN TOTAL: 0.4 mg/dL (ref 0.3–1.2)
BUN: 10 mg/dL (ref 6–20)
CALCIUM: 9.1 mg/dL (ref 8.9–10.3)
CO2: 26 mmol/L (ref 22–32)
Chloride: 101 mmol/L (ref 101–111)
Creatinine, Ser: 0.63 mg/dL (ref 0.44–1.00)
GFR calc Af Amer: >60 mL/min (ref >60)
GFR calc non Af Amer: >60 mL/min (ref >60)
GLUCOSE: 113 mg/dL — AB (ref 65–99)
Potassium: 3.7 mmol/L (ref 3.5–5.1)
Sodium: 134 mmol/L — ABNORMAL LOW (ref 135–145)
Total Protein: 7.6 g/dL (ref 6.5–8.1)

## 2017-01-18 LAB — WET PREP, GENITAL
Clue Cells Wet Prep HPF POC: NONE SEEN
Sperm: NONE SEEN
Trich, Wet Prep: NONE SEEN
Yeast Wet Prep HPF POC: NONE SEEN

## 2017-01-18 LAB — URINALYSIS, ROUTINE W REFLEX MICROSCOPIC
Bilirubin Urine: NEGATIVE
GLUCOSE, UA: NEGATIVE mg/dL
HGB URINE DIPSTICK: NEGATIVE
KETONES UR: NEGATIVE mg/dL
LEUKOCYTES UA: NEGATIVE
Nitrite: NEGATIVE
PH: 7.5 (ref 5.0–8.0)
Protein, ur: NEGATIVE mg/dL
Specific Gravity, Urine: 1.004 — ABNORMAL LOW (ref 1.005–1.030)

## 2017-01-18 LAB — PREGNANCY, URINE: Preg Test, Ur: NEGATIVE

## 2017-01-18 MED ORDER — SODIUM CHLORIDE 0.9 % IV BOLUS (SEPSIS)
1000.0000 mL | Freq: Once | INTRAVENOUS | Status: AC
  Administered 2017-01-18: 1000 mL via INTRAVENOUS

## 2017-01-18 MED ORDER — MORPHINE SULFATE (PF) 4 MG/ML IV SOLN
4.0000 mg | Freq: Once | INTRAVENOUS | Status: AC
  Administered 2017-01-18: 4 mg via INTRAVENOUS
  Filled 2017-01-18: qty 1

## 2017-01-18 MED ORDER — ONDANSETRON HCL 4 MG/2ML IJ SOLN
4.0000 mg | Freq: Once | INTRAMUSCULAR | Status: AC
  Administered 2017-01-18: 4 mg via INTRAVENOUS
  Filled 2017-01-18: qty 2

## 2017-01-18 MED ORDER — IOPAMIDOL (ISOVUE-300) INJECTION 61%
100.0000 mL | Freq: Once | INTRAVENOUS | Status: AC | PRN
  Administered 2017-01-18: 100 mL via INTRAVENOUS

## 2017-01-18 MED ORDER — HYDROMORPHONE HCL 1 MG/ML IJ SOLN
0.5000 mg | Freq: Once | INTRAMUSCULAR | Status: AC
  Administered 2017-01-18: 0.5 mg via INTRAVENOUS
  Filled 2017-01-18: qty 1

## 2017-01-18 MED ORDER — KETOROLAC TROMETHAMINE 30 MG/ML IJ SOLN
15.0000 mg | Freq: Once | INTRAMUSCULAR | Status: AC
  Administered 2017-01-18: 15 mg via INTRAVENOUS
  Filled 2017-01-18: qty 1

## 2017-01-18 NOTE — Discharge Instructions (Signed)
You do have a cyst on the right ovary. Motrin and Tylenol for pain. Heating pad to the affected area. Follow-up with [facility] this week. Return to the ED if symptoms worsen. Your diagnosis as ovarian torsion which you do not have.

## 2017-01-18 NOTE — ED Notes (Signed)
Pt returned from US with worsening pain.  PA notified.

## 2017-01-18 NOTE — ED Provider Notes (Signed)
[facility] Note   CSN: [id number] Arrival date & time: 01/18/17  1813   By signing my name below, I, Soijett Blue, attest that this documentation has been prepared under the direction and in the presence of Wilburn Mylar, PA-C Electronically Signed: Soijett Blue, ED Scribe. 01/18/17. 9:02 PM.  History   Chief Complaint Chief Complaint  Patient presents with  . Abdominal Pain    HPI Linda Archer is a 31 y.o. female with a PMHx of HTN who presents to the Emergency Department complaining of right sided abdominal pain onset 2 days ago. Pt reports associated constipation and nausea. Pt has tried OTC laxatives with relief of her symptoms. However her right-sided pain has continued. Pt right sided abdominal pain is worsened with palpation and movement. Denies having similar symptoms in the past. She denies vomiting, diarrhea, blood in stool, vaginal discharge, vaginal bleeding, urinary symptoms, fever, and any other symptoms. She states that she has a hx of ovarian cyst.   The history is provided by the patient. No language interpreter was used.    Past Medical History:  Diagnosis Date  . GERD (gastroesophageal reflux disease)   . Hypercholesteremia   . Hypertension   . Mitral valve prolapse     Patient Active Problem List   Diagnosis Date Noted  . Thrombocytopenia (HCC) 09/20/2015  . 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 Anxiety 01/22/2015  . Allergic rhinitis, seasonal 01/22/2015  . Left knee injury 01/22/2015  . Billowing mitral valve 12/24/2014  . 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 No pertinent surgical history.  OB History    Gravida Para Term Preterm AB Living   2 2       2    SAB TAB Ectopic Multiple Live Births                   Home Medications    Prior to Admission medications   Medication Sig Start Date End Date Taking? Authorizing Provider  Fluticasone Furoate-Vilanterol (BREO ELLIPTA IN)  Inhale into the lungs.   Yes Historical Provider, MD  Montelukast Sodium (SINGULAIR PO) Take by mouth.   Yes Historical Provider, MD  albuterol (PROVENTIL) (5 MG/ML) 0.5% nebulizer solution Inhale by nebulization. Dilute 0.86ml=1.25mg  to final volume of 3ml for inhalation every 3-4 hours as needed for wheezing    Historical Provider, MD  citalopram (CELEXA) 40 MG tablet Take 40 mg by mouth daily.    Historical Provider, MD    Family History No family history on file.  Social History Social History  Substance Use Topics  . Smoking status: Never Smoker  . Smokeless tobacco: Never Used  . Alcohol use 0.0 oz/week     Comment: occaisional      Allergies   Latex; Codeine; and Escitalopram   Review of Systems Review of Systems  Constitutional: Negative for fever.  Gastrointestinal: Positive for abdominal pain (right sided), constipation and nausea. Negative for blood in stool, diarrhea and vomiting.  Genitourinary: Negative for difficulty urinating, vaginal bleeding and vaginal discharge.    Physical Exam Updated Vital Signs BP 128/87 (BP Location: Right Arm)   Pulse 80   Temp 99 F (37.2 C) (Oral)   Resp 18   Ht 5' (1.524 m)   Wt 156 lb (70.8 kg)   LMP 01/07/2017   SpO2 99%   BMI 30.47 kg/m   Physical Exam  Constitutional: She is oriented to person, place, and time. She appears well-developed and well-nourished. No distress.  Non-toxic appearing.  HENT:  Head: Normocephalic and atraumatic.  Eyes: EOM are normal.  Neck: Neck supple.  Cardiovascular: Normal rate.   Pulmonary/Chest: Effort normal. No respiratory distress.  Abdominal: Soft. Bowel sounds are normal. She exhibits no distension. There is generalized tenderness and tenderness in the right upper quadrant, right lower quadrant and suprapubic area. There is rebound, guarding and tenderness at McBurney's point. There is no rigidity, no CVA tenderness and negative Murphy's sign.  +Heeljar. Negative psaos or  obturator sign.   Genitourinary:  Genitourinary Comments: Nurse chaperone present for exam. Right-sided adnexal tenderness. No fullness noted. No left-sided adnexal tenderness. No cervical motion tenderness. No vaginal discharge noted. Vaginal bleeding.  Musculoskeletal: Normal range of motion.  Neurological: She is alert and oriented to person, place, and time.  Skin: Skin is warm and dry. Capillary refill takes less than 2 seconds.  Psychiatric: She has a normal mood and affect. Her behavior is normal.  Nursing note and vitals reviewed.    ED Treatments / Results  DIAGNOSTIC STUDIES: Oxygen Saturation is 99% on RA, nl by my interpretation.    COORDINATION OF CARE: 9:02 PM Discussed treatment plan with pt at bedside which includes UA, US pelvis, US transvaginal non-OB, US art/ven abd pelv doppler, CT abdomen pelvis, pelvic exam, and pt agreed to plan.   Labs (all labs ordered are listed, but only abnormal results are displayed) Labs Reviewed  WET PREP, GENITAL - Abnormal; Notable for the following:       Result Value   WBC, Wet Prep HPF POC FEW (*)    All other components within normal limits  URINALYSIS, ROUTINE W REFLEX MICROSCOPIC - Abnormal; Notable for the following:    Specific Gravity, Urine 1.004 (*)    All other components within normal limits  COMPREHENSIVE METABOLIC PANEL - Abnormal; Notable for the following:    Sodium 134 (*)    Glucose, Bld 113 (*)    ALT 12 (*)    All other components within normal limits  CBC WITH DIFFERENTIAL/PLATELET - Abnormal; Notable for the following:    WBC 12.4 (*)    RBC 5.22 (*)    Neutro Abs 9.6 (*)    All other components within normal limits  PREGNANCY, URINE    EKG  EKG Interpretation None       Radiology [place] Transvaginal Non-ob  Result Date: 01/18/2017 CLINICAL DATA:  Right lower quadrant pain for 2 days EXAM: TRANSABDOMINAL AND TRANSVAGINAL ULTRASOUND OF PELVIS DOPPLER ULTRASOUND OF OVARIES TECHNIQUE: Both  transabdominal and transvaginal ultrasound examinations of the pelvis were performed. Transabdominal technique was performed for global imaging of the pelvis including uterus, ovaries, adnexal regions, and pelvic cul-de-sac. It was necessary to proceed with endovaginal exam following the transabdominal exam to visualize the bilateral ovaries. Color and duplex Doppler ultrasound was utilized to evaluate blood flow to the ovaries. COMPARISON:  None. FINDINGS: Uterus Measurements: 7.3 x 4.4 x 4.4 cm. No fibroids or other mass visualized. Endometrium Thickness: 9.3 mm.  No focal abnormality visualized. Right ovary Measurements: 4.3 x 4.5 x 3.1 cm. Simple cysts measuring 3.2 x 2.7 x 3 cm Left ovary Measurements: 3.6 x 1.7 x 1.3 cm. Normal appearance/no adnexal mass. Pulsed Doppler evaluation of both ovaries demonstrates normal low-resistance arterial and venous waveforms. Other findings Small free fluid in the pelvis and right adnexa. IMPRESSION: 1. No sonographic evidence for ovarian torsion 2. 3.2 cm cyst in the right ovary. Small amount of free fluid in the pelvis and right adnexa.  Electronically Signed   By: Jasmine Pang M.D.   On: 01/18/2017 22:27   US Pelvis Complete  Result Date: 01/18/2017 CLINICAL DATA:  Right lower quadrant pain for 2 days EXAM: TRANSABDOMINAL AND TRANSVAGINAL ULTRASOUND OF PELVIS DOPPLER ULTRASOUND OF OVARIES TECHNIQUE: Both transabdominal and transvaginal ultrasound examinations of the pelvis were performed. Transabdominal technique was performed for global imaging of the pelvis including uterus, ovaries, adnexal regions, and pelvic cul-de-sac. It was necessary to proceed with endovaginal exam following the transabdominal exam to visualize the bilateral ovaries. Color and duplex Doppler ultrasound was utilized to evaluate blood flow to the ovaries. COMPARISON:  None. FINDINGS: Uterus Measurements: 7.3 x 4.4 x 4.4 cm. No fibroids or other mass visualized. Endometrium Thickness: 9.3 mm.  No  focal abnormality visualized. Right ovary Measurements: 4.3 x 4.5 x 3.1 cm. Simple cysts measuring 3.2 x 2.7 x 3 cm Left ovary Measurements: 3.6 x 1.7 x 1.3 cm. Normal appearance/no adnexal mass. Pulsed Doppler evaluation of both ovaries demonstrates normal low-resistance arterial and venous waveforms. Other findings Small free fluid in the pelvis and right adnexa. IMPRESSION: 1. No sonographic evidence for ovarian torsion 2. 3.2 cm cyst in the right ovary. Small amount of free fluid in the pelvis and right adnexa. Electronically Signed   By: Jasmine Pang M.D.   On: 01/18/2017 22:27   Ct Abdomen Pelvis W Contrast  Result Date: 01/18/2017 CLINICAL DATA:  RIGHT lower quadrant pain for 2 days, nausea and constipation. Assess for appendicitis. History of ovarian cyst. EXAM: CT ABDOMEN AND PELVIS WITH CONTRAST TECHNIQUE: Multidetector CT imaging of the abdomen and pelvis was performed using the standard protocol following bolus administration of intravenous contrast. CONTRAST:  ISOVUE-300 IOPAMIDOL (ISOVUE-300) INJECTION 61% COMPARISON:  Pelvic ultrasound January 18, 2017 at 2202 hours and CT abdomen and pelvis February 02, 2014 FINDINGS: LOWER CHEST: Lung bases are clear. Included heart size is normal. No pericardial effusion. Mild gas distended distal esophagus associated with reflux. HEPATOBILIARY: Liver and gallbladder are normal. PANCREAS: Normal. SPLEEN: Normal. ADRENALS/URINARY TRACT: Kidneys are orthotopic, demonstrating symmetric enhancement. No nephrolithiasis, hydronephrosis or solid renal masses. The unopacified ureters are normal in course and caliber. Urinary bladder is partially distended and unremarkable. Normal adrenal glands. STOMACH/BOWEL: The stomach, small and large bowel are normal in course and caliber without inflammatory changes. Enteric contrast has not yet reached the distal small bowel. Normal appendix. VASCULAR/LYMPHATIC: Aortoiliac vessels are normal in course and caliber. No  lymphadenopathy by CT size criteria. REPRODUCTIVE: 3.4 cm benign-appearing RIGHT adnexal cyst. OTHER: Small amount of low-density free fluid RIGHT pelvis. No drainable fluid collection. Known intraperitoneal free air. MUSCULOSKELETAL: Nonacute. Mild broad levoscoliosis on this nonweightbearing examination. IMPRESSION: 3.4 cm benign-appearing RIGHT ovarian cyst. Small amount of free fluid RIGHT pelvis suggests leaking/ruptured ovarian cyst. Normal appendix. Electronically Signed   By: Awilda Metro M.D.   On: 01/18/2017 22:30   [place] Art/ven Flow Abd Pelv Doppler  Result Date: 01/18/2017 CLINICAL DATA:  Right lower quadrant pain for 2 days EXAM: TRANSABDOMINAL AND TRANSVAGINAL ULTRASOUND OF PELVIS DOPPLER ULTRASOUND OF OVARIES TECHNIQUE: Both transabdominal and transvaginal ultrasound examinations of the pelvis were performed. Transabdominal technique was performed for global imaging of the pelvis including uterus, ovaries, adnexal regions, and pelvic cul-de-sac. It was necessary to proceed with endovaginal exam following the transabdominal exam to visualize the bilateral ovaries. Color and duplex Doppler ultrasound was utilized to evaluate blood flow to the ovaries. COMPARISON:  None. FINDINGS: Uterus Measurements: 7.3 x 4.4 x 4.4 cm. No fibroids  or other mass visualized. Endometrium Thickness: 9.3 mm.  No focal abnormality visualized. Right ovary Measurements: 4.3 x 4.5 x 3.1 cm. Simple cysts measuring 3.2 x 2.7 x 3 cm Left ovary Measurements: 3.6 x 1.7 x 1.3 cm. Normal appearance/no adnexal mass. Pulsed Doppler evaluation of both ovaries demonstrates normal low-resistance arterial and venous waveforms. Other findings Small free fluid in the pelvis and right adnexa. IMPRESSION: 1. No sonographic evidence for ovarian torsion 2. 3.2 cm cyst in the right ovary. Small amount of free fluid in the pelvis and right adnexa. Electronically Signed   By: Jasmine Pang M.D.   On: 01/18/2017 22:27     Procedures Procedures (including critical care time)  Medications Ordered in ED Medications  sodium chloride 0.9 % bolus 1,000 mL (0 mLs Intravenous Stopped 01/18/17 2300)  ondansetron (ZOFRAN) injection 4 mg (4 mg Intravenous Given 01/18/17 2123)  morphine 4 MG/ML injection 4 mg (4 mg Intravenous Given 01/18/17 2123)  iopamidol (ISOVUE-300) 61 % injection 100 mL (100 mLs Intravenous Contrast Given 01/18/17 2216)  HYDROmorphone (DILAUDID) injection 0.5 mg (0.5 mg Intravenous Given 01/18/17 2248)  ketorolac (TORADOL) 30 MG/ML injection 15 mg (15 mg Intravenous Given 01/18/17 2300)     Initial Impression / Assessment and Plan / ED Course  I have reviewed the triage vital signs and the nursing notes.  Pertinent labs & imaging results that were available during my care of the patient were reviewed by me and considered in my medical decision making (see chart for details).     Patient presents to the ED with right lower quadrant abdominal pain onset 2 hours ago. Associated with nausea. History of ovarian cysts. Patient with significant tenderness to her right lower quadrant. Pelvic exam revealed right-sided adnexal tenderness. Ultrasound was obtained that showed 3.2 cm ovarian cyst with small amount of free fluid likely from ruptured cyst or leaking. CAT scan of abdomen and pelvis was ordered prior to ultrasound as exam was concerning for acute appendicitis. Patient with mild leukocytosis of 12,000. All other labs are normal. CT scan revealed no appendicitis. CT notes  3.4 cm benign-appearing RIGHT ovarian cyst. Small amount of free fluid RIGHT pelvis suggests leaking/ruptured ovarian cyst. Pain has been adequately controlled in the ED. She has had no episodes of emesis. Urine shows no signs of infection. Small amount of wbc's and the wet prep without any discharge. Low suspicion for PID. Repeat abdominal exam was benign. There is no rebound or tenderness in the right lower quadrant. Patient feels much  improved. Able tolerate by mouth fluids without any emesis. Encouraged to follow up with her OB/GYN for recheck. Encouraged Motrin and Tylenol and heat at home for pain. Patient is nontoxic appearing. Vital signs are stable. She is afebrile and not tachycardic. Have given her strict return precautions. Verbalized understanding the plan of care. All questions were answered prior to discharge.  Final Clinical Impressions(s) / ED Diagnoses   Final diagnoses:  Ovarian torsion  Cyst of right ovary    New Prescriptions Discharge Medication List as of 01/18/2017 11:44 PM     I personally performed the services described in this documentation, which was scribed in my presence. The recorded information has been reviewed and is accurate.     Rise Mu, PA-C 01/19/17 0059    Tilden Fossa, MD 01/19/17 1538

## 2017-01-18 NOTE — ED Triage Notes (Signed)
C/o abd pain x 2 days-hx of constipation-took laxative-pain not relieved-denies n/v, vaginal d/c and urinary s/s-NAD-steady gait

## 2017-01-18 NOTE — ED Notes (Signed)
ED Provider at bedside. 

## 2017-01-18 NOTE — ED Notes (Signed)
Patient transported to Ultrasound 

## 2017-01-19 NOTE — ED Notes (Signed)
ED Provider at bedside. 

## 2017-06-09 ENCOUNTER — Emergency Department (HOSPITAL_BASED_OUTPATIENT_CLINIC_OR_DEPARTMENT_OTHER)
Admission: EM | Admit: 2017-06-09 | Discharge: 2017-06-09 | Disposition: A | Discharge status: Home / Self Care | Payer: Managed Care, Other (non HMO) | Attending: Emergency Medicine | Admitting: Emergency Medicine

## 2017-06-09 ENCOUNTER — Encounter (HOSPITAL_BASED_OUTPATIENT_CLINIC_OR_DEPARTMENT_OTHER): Payer: Self-pay | Admitting: *Deleted

## 2017-06-09 ENCOUNTER — Emergency Department (HOSPITAL_BASED_OUTPATIENT_CLINIC_OR_DEPARTMENT_OTHER): Payer: Managed Care, Other (non HMO)

## 2017-06-09 DIAGNOSIS — Z91040 Latex allergy status: Secondary | ICD-10-CM | POA: Insufficient documentation

## 2017-06-09 DIAGNOSIS — Z79899 Other long term (current) drug therapy: Secondary | ICD-10-CM | POA: Insufficient documentation

## 2017-06-09 DIAGNOSIS — R102 Pelvic and perineal pain: Secondary | ICD-10-CM

## 2017-06-09 DIAGNOSIS — I10 Essential (primary) hypertension: Secondary | ICD-10-CM | POA: Insufficient documentation

## 2017-06-09 DIAGNOSIS — R52 Pain, unspecified: Secondary | ICD-10-CM

## 2017-06-09 DIAGNOSIS — R1032 Left lower quadrant pain: Secondary | ICD-10-CM | POA: Insufficient documentation

## 2017-06-09 HISTORY — DX: Unspecified ovarian cyst, unspecified side: N83.209

## 2017-06-09 LAB — URINALYSIS, MICROSCOPIC (REFLEX)

## 2017-06-09 LAB — URINALYSIS, ROUTINE W REFLEX MICROSCOPIC
Bilirubin Urine: NEGATIVE
Glucose, UA: NEGATIVE mg/dL
Hgb urine dipstick: NEGATIVE
KETONES UR: NEGATIVE mg/dL
NITRITE: NEGATIVE
PROTEIN: NEGATIVE mg/dL
Specific Gravity, Urine: 1.023 (ref 1.005–1.030)
pH: 8 (ref 5.0–8.0)

## 2017-06-09 LAB — WET PREP, GENITAL
SPERM: NONE SEEN
TRICH WET PREP: NONE SEEN
Yeast Wet Prep HPF POC: NONE SEEN

## 2017-06-09 LAB — PREGNANCY, URINE: Preg Test, Ur: NEGATIVE

## 2017-06-09 MED ORDER — IBUPROFEN 400 MG PO TABS
400.0000 mg | ORAL_TABLET | Freq: Once | ORAL | Status: AC
Start: 2017-06-09 — End: 2017-06-09
  Administered 2017-06-09: 400 mg via ORAL
  Filled 2017-06-09: qty 1

## 2017-06-09 MED ORDER — METRONIDAZOLE 500 MG PO TABS: 500.0000 mg | ORAL_TABLET | Freq: Two times a day (BID) | ORAL | 0 refills | Status: DC

## 2017-06-09 MED ORDER — ACETAMINOPHEN 500 MG PO TABS
1000.0000 mg | ORAL_TABLET | Freq: Once | ORAL | Status: AC
  Administered 2017-06-09: 1000 mg via ORAL
  Filled 2017-06-09: qty 2

## 2017-06-09 MED FILL — metroNIDAZOLE 500 MG TABS: 500 | 7 days supply | Qty: 14 | Fill #0

## 2017-06-09 NOTE — ED Triage Notes (Signed)
Pt reports lower abdominal pain x 2 days, Worse today and worse on left. Reports similar pain in the past with ovarian cysts

## 2017-06-09 NOTE — ED Notes (Signed)
Patient transported to Ultrasound 

## 2017-06-09 NOTE — ED Provider Notes (Signed)
[facility]   CSN: [id number] Arrival date & time: 06/09/17  0846     History   Chief Complaint Chief Complaint  Patient presents with  . Abdominal Pain    HPI Curtis SitesKristina Archer is a 31 y.o. female.  Patient c/o dull, constant, non radiating, left lower abdominal pain for the past 2 days. Similar to prior pain related to ovarian cyst.   +dysuria. No hematuria. lnmp 3 weeks ago. No vaginal bleeding. Mild whitish discharge but states has 'had in past when had cysts'.   No fever or chills. No vomiting or diarrhea.    The history is provided by the patient.    Past Medical History:  Diagnosis Date  . GERD (gastroesophageal reflux disease)   . Hypercholesteremia   . Hypertension   . Mitral valve prolapse   . Ovarian cyst     Patient Active Problem List   Diagnosis Date Noted  . Thrombocytopenia (HCC) 09/20/2015  . 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 Anxiety 01/22/2015  . Allergic rhinitis, seasonal 01/22/2015  . Left knee injury 01/22/2015  . Billowing mitral valve 12/24/2014  . 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 No pertinent surgical history.  OB History    Gravida Para Term Preterm AB Living   2 2       2    SAB TAB Ectopic Multiple Live Births                   Home Medications    Prior to Admission medications   Medication Sig Start Date End Date Taking? Authorizing Provider  albuterol (PROVENTIL HFA;VENTOLIN HFA) 108 (90 Base) MCG/ACT inhaler Inhale 2 puffs into the lungs every 6 (six) hours as needed for wheezing or shortness of breath.   Yes [provider]  albuterol (PROVENTIL) (5 MG/ML) 0.5% nebulizer solution Inhale by nebulization. Dilute 0.4725ml=1.25mg  to final volume of 3ml for inhalation every 3-4 hours as needed for wheezing   Yes [provider]  citalopram (CELEXA) 40 MG tablet Take 40 mg by mouth daily.   Yes [provider]  Fluticasone Furoate-Vilanterol  (BREO ELLIPTA IN) Inhale into the lungs.   Yes [provider]  Montelukast Sodium (SINGULAIR PO) Take by mouth.   Yes [provider]    Family History No family history on file.  Social History Social History  Substance Use Topics  . Smoking status: Never Smoker  . Smokeless tobacco: Never Used  . Alcohol use No     Allergies   Latex; Codeine; and Escitalopram   Review of Systems Review of Systems  Constitutional: Negative for fever.  HENT: Negative for sore throat.   Eyes: Negative for redness.  Respiratory: Negative for shortness of breath.   Cardiovascular: Negative for chest pain.  Gastrointestinal: Negative for abdominal pain.  Genitourinary: Negative for flank pain.  Musculoskeletal: Negative for back pain.  Skin: Negative for rash.  Neurological: Negative for headaches.  Hematological: Does not bruise/bleed easily.  Psychiatric/Behavioral: Negative for confusion.     Physical Exam Updated Vital Signs BP 124/88 (BP Location: Right Arm)   Pulse 70   Temp 98.2 F (36.8 C) (Oral)   Resp 18   Ht 1.524 m (5')   Wt 70.8 kg (156 lb)   LMP 05/18/2017   SpO2 98%   BMI 30.47 kg/m   Physical Exam  Constitutional: She appears well-developed and well-nourished. No distress.  HENT:  Mouth/Throat: Oropharynx is clear and moist.  Eyes:  Conjunctivae are normal. No scleral icterus.  Neck: Neck supple. No tracheal deviation present.  Cardiovascular: Normal rate, regular rhythm, normal heart sounds and intact distal pulses.   Pulmonary/Chest: Effort normal and breath sounds normal. No respiratory distress.  Abdominal: Soft. Normal appearance and bowel sounds are normal. She exhibits no distension and no mass. There is no tenderness. There is no rebound and no guarding. No hernia.  Genitourinary:  Genitourinary Comments: No cva tenderness. Normal external gu exam. Mild-mod whitish vaginal d/c. Cervix closed. No cmt. No adnexal masses/tenderness.     Musculoskeletal: She exhibits no edema.  Neurological: She is alert.  Skin: Skin is warm and dry. No rash noted. She is not diaphoretic.  Psychiatric: She has a normal mood and affect.  Nursing note and vitals reviewed.    ED Treatments / Results  Labs (all labs ordered are listed, but only abnormal results are displayed) Results for orders placed or performed during the hospital encounter of 06/09/17  Wet prep, genital  Result Value Ref Range   Yeast Wet Prep HPF POC NONE SEEN NONE SEEN   Trich, Wet Prep NONE SEEN NONE SEEN   Clue Cells Wet Prep HPF POC PRESENT (A) NONE SEEN   WBC, Wet Prep HPF POC MANY (A) NONE SEEN   Sperm NONE SEEN   Urinalysis, Routine w reflex microscopic  Result Value Ref Range   Color, Urine AMBER (A) YELLOW   APPearance CLOUDY (A) CLEAR   Specific Gravity, Urine 1.023 1.005 - 1.030   pH 8.0 5.0 - 8.0   Glucose, UA NEGATIVE NEGATIVE mg/dL   Hgb urine dipstick NEGATIVE NEGATIVE   Bilirubin Urine NEGATIVE NEGATIVE   Ketones, ur NEGATIVE NEGATIVE mg/dL   Protein, ur NEGATIVE NEGATIVE mg/dL   Nitrite NEGATIVE NEGATIVE   Leukocytes, UA MODERATE (A) NEGATIVE  Pregnancy, urine  Result Value Ref Range   Preg Test, Ur NEGATIVE NEGATIVE  Urinalysis, Microscopic (reflex)  Result Value Ref Range   RBC / HPF 0-5 0 - 5 RBC/hpf   WBC, UA 0-5 0 - 5 WBC/hpf   Bacteria, UA MANY (A) NONE SEEN   Squamous Epithelial / LPF 6-30 (A) NONE SEEN   US Transvaginal Non-ob  Result Date: 06/09/2017 CLINICAL DATA:  31 year old with left adnexal pain for 2 days. EXAM: TRANSABDOMINAL AND TRANSVAGINAL ULTRASOUND OF PELVIS TECHNIQUE: Both transabdominal and transvaginal ultrasound examinations of the pelvis were performed. Transabdominal technique was performed for global imaging of the pelvis including uterus, ovaries, adnexal regions, and pelvic cul-de-sac. It was necessary to proceed with endovaginal exam following the transabdominal exam to visualize the endometrium and  ovaries to better advantage. COMPARISON:  Pelvic ultrasound 01/18/2017.  Pelvic CT 01/18/2017. FINDINGS: Uterus Measurements: 7.2 x 3.9 x 4.9 cm. The uterus is retroverted. No fibroids or other mass visualized. Endometrium Thickness: 5 mm.  No focal abnormality visualized. Right ovary Measurements: 2.5 x 1.4 x 1.4 cm. Normal blood flow with color Doppler. Normal appearance/no adnexal mass. Left ovary Measurements: 3.8 x 1.9 x 1.8 cm. A small collapsing follicle is noted. Normal blood flow with color Doppler. Other findings Small amount of free pelvic fluid, within physiologic limits. IMPRESSION: 1. Small collapsing left ovarian follicle without significant free pelvic fluid. 2. No evidence of ovarian torsion. 3. The uterus appears unremarkable. Electronically Signed   By: Carey Bullocks M.D.   On: 06/09/2017 11:19   US Pelvis Complete  Result Date: 06/09/2017 CLINICAL DATA:  31 year old with left adnexal pain for 2 days. EXAM: TRANSABDOMINAL AND TRANSVAGINAL  ULTRASOUND OF PELVIS TECHNIQUE: Both transabdominal and transvaginal ultrasound examinations of the pelvis were performed. Transabdominal technique was performed for global imaging of the pelvis including uterus, ovaries, adnexal regions, and pelvic cul-de-sac. It was necessary to proceed with endovaginal exam following the transabdominal exam to visualize the endometrium and ovaries to better advantage. COMPARISON:  Pelvic ultrasound 01/18/2017.  Pelvic CT 01/18/2017. FINDINGS: Uterus Measurements: 7.2 x 3.9 x 4.9 cm. The uterus is retroverted. No fibroids or other mass visualized. Endometrium Thickness: 5 mm.  No focal abnormality visualized. Right ovary Measurements: 2.5 x 1.4 x 1.4 cm. Normal blood flow with color Doppler. Normal appearance/no adnexal mass. Left ovary Measurements: 3.8 x 1.9 x 1.8 cm. A small collapsing follicle is noted. Normal blood flow with color Doppler. Other findings Small amount of free pelvic fluid, within physiologic limits.  IMPRESSION: 1. Small collapsing left ovarian follicle without significant free pelvic fluid. 2. No evidence of ovarian torsion. 3. The uterus appears unremarkable. Electronically Signed   By: Carey BullocksWilliam  Veazey M.D.   On: 06/09/2017 11:19    EKG  EKG Interpretation None       Radiology No results found.  Procedures Procedures (including critical care time)  Medications Ordered in ED Medications - No data to display   Initial Impression / Assessment and Plan / ED Course  I have reviewed the triage vital signs and the nursing notes.  Pertinent labs & imaging results that were available during my care of the patient were reviewed by me and considered in my medical decision making (see chart for details).  Labs sent.   Acetaminophen po. Ibuprofen po.   Ultrasound.  Reviewed nursing notes and prior charts for additional history.   Recheck abd soft nt.   Clue cells on wet prep, + d/c. Will tx.     Final Clinical Impressions(s) / ED Diagnoses   Final diagnoses:  None    New Prescriptions New Prescriptions   No medications on file     Cathren LaineSteinl, Shaquanta Harkless, MD 06/09/17 1250

## 2017-06-09 NOTE — Discharge Instructions (Signed)
It was our pleasure to provide your ER care today - we hope that you feel better.  For vaginal discharge, take antibiotic as prescribed. (do not drink alcohol when taking this antibiotic).  Follow up with your doctor in 1 week if symptoms fail to improve/resolve.  Return to ER if worse, new symptoms, fevers, worsening or severe pain, other concern.

## 2017-06-10 LAB — GC/CHLAMYDIA PROBE AMP (~~LOC~~) NOT AT ARMC
CHLAMYDIA, DNA PROBE: NEGATIVE
NEISSERIA GONORRHEA: NEGATIVE

## 2017-07-07 ENCOUNTER — Emergency Department (HOSPITAL_BASED_OUTPATIENT_CLINIC_OR_DEPARTMENT_OTHER)
Admission: EM | Admit: 2017-07-07 | Discharge: 2017-07-07 | Disposition: A | Discharge status: Home / Self Care | Payer: Self-pay | Attending: Emergency Medicine | Admitting: Emergency Medicine

## 2017-07-07 ENCOUNTER — Encounter (HOSPITAL_BASED_OUTPATIENT_CLINIC_OR_DEPARTMENT_OTHER): Payer: Self-pay | Admitting: Emergency Medicine

## 2017-07-07 DIAGNOSIS — B373 Candidiasis of vulva and vagina: Secondary | ICD-10-CM | POA: Insufficient documentation

## 2017-07-07 DIAGNOSIS — I10 Essential (primary) hypertension: Secondary | ICD-10-CM | POA: Insufficient documentation

## 2017-07-07 DIAGNOSIS — N760 Acute vaginitis: Secondary | ICD-10-CM | POA: Insufficient documentation

## 2017-07-07 DIAGNOSIS — R102 Pelvic and perineal pain: Secondary | ICD-10-CM | POA: Insufficient documentation

## 2017-07-07 DIAGNOSIS — Z79899 Other long term (current) drug therapy: Secondary | ICD-10-CM | POA: Insufficient documentation

## 2017-07-07 DIAGNOSIS — B379 Candidiasis, unspecified: Secondary | ICD-10-CM

## 2017-07-07 DIAGNOSIS — R1111 Vomiting without nausea: Secondary | ICD-10-CM | POA: Insufficient documentation

## 2017-07-07 DIAGNOSIS — B9689 Other specified bacterial agents as the cause of diseases classified elsewhere: Secondary | ICD-10-CM

## 2017-07-07 LAB — URINALYSIS, MICROSCOPIC (REFLEX)

## 2017-07-07 LAB — WET PREP, GENITAL
Sperm: NONE SEEN
TRICH WET PREP: NONE SEEN

## 2017-07-07 LAB — PREGNANCY, URINE: Preg Test, Ur: NEGATIVE

## 2017-07-07 LAB — URINALYSIS, ROUTINE W REFLEX MICROSCOPIC
BILIRUBIN URINE: NEGATIVE
GLUCOSE, UA: NEGATIVE mg/dL
HGB URINE DIPSTICK: NEGATIVE
Ketones, ur: NEGATIVE mg/dL
Nitrite: NEGATIVE
PH: 6 (ref 5.0–8.0)
Protein, ur: NEGATIVE mg/dL
SPECIFIC GRAVITY, URINE: 1.02 (ref 1.005–1.030)

## 2017-07-07 MED ORDER — METRONIDAZOLE 500 MG PO TABS: 500.0000 mg | ORAL_TABLET | Freq: Two times a day (BID) | ORAL | 0 refills | Status: DC

## 2017-07-07 MED ORDER — KETOROLAC TROMETHAMINE 60 MG/2ML IM SOLN
15.0000 mg | Freq: Once | INTRAMUSCULAR | Status: AC
  Administered 2017-07-07: 15 mg via INTRAMUSCULAR
  Filled 2017-07-07: qty 2

## 2017-07-07 MED ORDER — ONDANSETRON 4 MG PO TBDP
4.0000 mg | ORAL_TABLET | Freq: Once | ORAL | Status: AC
  Administered 2017-07-07: 4 mg via ORAL
  Filled 2017-07-07: qty 1

## 2017-07-07 MED ORDER — FLUCONAZOLE 50 MG PO TABS
150.0000 mg | ORAL_TABLET | Freq: Once | ORAL | Status: AC
  Administered 2017-07-07: 150 mg via ORAL
  Filled 2017-07-07: qty 1

## 2017-07-07 MED ORDER — ACETAMINOPHEN 500 MG PO TABS
1000.0000 mg | ORAL_TABLET | Freq: Once | ORAL | Status: AC
  Administered 2017-07-07: 1000 mg via ORAL
  Filled 2017-07-07: qty 2

## 2017-07-07 NOTE — Discharge Instructions (Signed)
Follow up with GYN.  They may need to start you on birth control to regulate your cycles.

## 2017-07-07 NOTE — ED Triage Notes (Signed)
Pt c/o BLQ pain x 1 wk; NV since last pm; severe lower back pain this am

## 2017-07-07 NOTE — ED Provider Notes (Signed)
[facility]   CSN: [id number] Arrival date & time: 07/07/17  2841     History   Chief Complaint Chief Complaint  Patient presents with  . Emesis  . Abdominal Pain    HPI Linda Archer is a 31 y.o. female.  31 yo F with a chief complaint of left lower quadrant abdominal pain. Going on for the past week or so. Patient was seen in the ED about that time and diagnosed with a ovarian cyst. Patient thinks that pain resolved and then recurred. She also has not had a bowel movement about the same time. Having some nausea and vomiting. Pain is colicky. Denies dysuria or increased frequency or hesitancy. Denies vaginal bleeding or discharge. Feels that the pain radiates into her lower back.   The history is provided by the patient.  Emesis   Associated symptoms include abdominal pain. Pertinent negatives include no arthralgias, no chills, no fever, no headaches and no myalgias.  Abdominal Pain   This is a recurrent problem. The current episode started more than 1 week ago. The problem occurs constantly. The problem has been gradually worsening. The pain is located in the LLQ. The quality of the pain is sharp and shooting. The pain is at a severity of 8/10. The pain is severe. Associated symptoms include nausea, vomiting and constipation. Pertinent negatives include fever, dysuria, headaches, arthralgias and myalgias. Nothing aggravates the symptoms. Nothing relieves the symptoms.    Past Medical History:  Diagnosis Date  . GERD (gastroesophageal reflux disease)   . Hypercholesteremia   . Hypertension   . Mitral valve prolapse   . Ovarian cyst     Patient Active Problem List   Diagnosis Date Noted  . Thrombocytopenia (HCC) 09/20/2015  . 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 Anxiety 01/22/2015  . Allergic rhinitis, seasonal 01/22/2015  . Left knee injury 01/22/2015  . Billowing mitral valve 12/24/2014  . Episodic paroxysmal anxiety disorder 12/24/2014     History reviewed. No pertinent surgical history.  OB History    Gravida Para Term Preterm AB Living   2 2       2    SAB TAB Ectopic Multiple Live Births                   Home Medications    Prior to Admission medications   Medication Sig Start Date End Date Taking? Authorizing Provider  albuterol (PROVENTIL HFA;VENTOLIN HFA) 108 (90 Base) MCG/ACT inhaler Inhale 2 puffs into the lungs every 6 (six) hours as needed for wheezing or shortness of breath.    [provider]  albuterol (PROVENTIL) (5 MG/ML) 0.5% nebulizer solution Inhale by nebulization. Dilute 0.63ml=1.25mg  to final volume of 3ml for inhalation every 3-4 hours as needed for wheezing    [provider]  citalopram (CELEXA) 40 MG tablet Take 40 mg by mouth daily.    [provider]  Fluticasone Furoate-Vilanterol (BREO ELLIPTA IN) Inhale into the lungs.    [provider]  metroNIDAZOLE (FLAGYL) 500 MG tablet Take 1 tablet (500 mg total) by mouth 2 (two) times daily. One po bid x 7 days 07/07/17   Melene Plan, DO  Montelukast Sodium (SINGULAIR PO) Take by mouth.    [provider]    Family History No family history on file.  Social History Social History  Substance Use Topics  . Smoking status: Never Smoker  . Smokeless tobacco: Never Used  . Alcohol use No     Allergies  Latex; Codeine; and Escitalopram   Review of Systems Review of Systems  Constitutional: Negative for chills and fever.  HENT: Negative for congestion and rhinorrhea.   Eyes: Negative for redness and visual disturbance.  Respiratory: Negative for shortness of breath and wheezing.   Cardiovascular: Negative for chest pain and palpitations.  Gastrointestinal: Positive for abdominal pain, constipation, nausea and vomiting.  Genitourinary: Negative for dysuria and urgency.  Musculoskeletal: Negative for arthralgias and myalgias.  Skin: Negative for pallor and wound.  Neurological: Negative for  dizziness and headaches.     Physical Exam Updated Vital Signs BP 120/81 (BP Location: Right Arm)   Pulse 75   Temp 98.2 F (36.8 C) (Oral)   Resp 18   Ht 5' (1.524 m)   Wt 70.3 kg (155 lb)   LMP 06/16/2017   SpO2 98%   BMI 30.27 kg/m   Physical Exam  Constitutional: She is oriented to person, place, and time. She appears well-developed and well-nourished. No distress.  HENT:  Head: Normocephalic and atraumatic.  Eyes: Pupils are equal, round, and reactive to light. EOM are normal.  Neck: Normal range of motion. Neck supple.  Cardiovascular: Normal rate and regular rhythm.  Exam reveals no gallop and no friction rub.   No murmur heard. Pulmonary/Chest: Effort normal. She has no wheezes. She has no rales.  Abdominal: Soft. She exhibits no distension and no mass. There is tenderness (mild very low left sided). There is no guarding.  Genitourinary: Uterus is tender. Cervix exhibits motion tenderness (mild) and discharge. Right adnexum displays no mass, no tenderness and no fullness. Left adnexum displays no mass, no tenderness and no fullness.  Musculoskeletal: She exhibits no edema or tenderness.  Neurological: She is alert and oriented to person, place, and time.  Skin: Skin is warm and dry. She is not diaphoretic.  Psychiatric: She has a normal mood and affect. Her behavior is normal.  Nursing note and vitals reviewed.    ED Treatments / Results  Labs (all labs ordered are listed, but only abnormal results are displayed) Labs Reviewed  WET PREP, GENITAL - Abnormal; Notable for the following:       Result Value   Yeast Wet Prep HPF POC PRESENT (*)    Clue Cells Wet Prep HPF POC PRESENT (*)    WBC, Wet Prep HPF POC MODERATE (*)    All other components within normal limits  URINALYSIS, ROUTINE W REFLEX MICROSCOPIC - Abnormal; Notable for the following:    Color, Urine AMBER (*)    Leukocytes, UA SMALL (*)    All other components within normal limits  URINALYSIS,  MICROSCOPIC (REFLEX) - Abnormal; Notable for the following:    Bacteria, UA MANY (*)    Squamous Epithelial / LPF 6-30 (*)    All other components within normal limits  PREGNANCY, URINE  RPR  HIV ANTIBODY (ROUTINE TESTING)  GC/CHLAMYDIA PROBE AMP ([facility]) NOT AT [facility]    EKG  EKG Interpretation None       Radiology No results found.  Procedures Procedures (including critical care time)  Medications Ordered in ED Medications  fluconazole (DIFLUCAN) tablet 150 mg (not administered)  ketorolac (TORADOL) injection 15 mg (15 mg Intramuscular Given 07/07/17 1031)  acetaminophen (TYLENOL) tablet 1,000 mg (1,000 mg Oral Given 07/07/17 1030)  ondansetron (ZOFRAN-ODT) disintegrating tablet 4 mg (4 mg Oral Given 07/07/17 1030)     Initial Impression / Assessment and Plan / ED Course  I have reviewed the triage vital signs  and the nursing notes.  Pertinent labs & imaging results that were available during my care of the patient were reviewed by me and considered in my medical decision making (see chart for details).     30 yo F with a chief complaint of left-sided lower abdominal pain. Colicky in nature. Going on for at least a week. Well-appearing and nontoxic on exam. Feel that ovarian torsion is unlikely. The patient does have a history of recurrent ovarian cysts. Suggest that she follow-up with OB/GYN.  Pelvic exam with cervical motion tenderness though albeit mild and the patient just had GC run that was negative. Will hold off on treatment at this time.  Wet prep with continued BV, retreat.  Also with + yeast.  GYN follow up.  11:15 AM:  I have discussed the diagnosis/risks/treatment options with the patient and believe the pt to be eligible for discharge home to follow-up with GYN. We also discussed returning to the ED immediately if new or worsening sx occur. We discussed the sx which are most concerning (e.g., sudden worsening pain, fever, inability to tolerate by mouth)  that necessitate immediate return. Medications administered to the patient during their visit and any new prescriptions provided to the patient are listed below.  Medications given during this visit Medications  fluconazole (DIFLUCAN) tablet 150 mg (not administered)  ketorolac (TORADOL) injection 15 mg (15 mg Intramuscular Given 07/07/17 1031)  acetaminophen (TYLENOL) tablet 1,000 mg (1,000 mg Oral Given 07/07/17 1030)  ondansetron (ZOFRAN-ODT) disintegrating tablet 4 mg (4 mg Oral Given 07/07/17 1030)     The patient appears reasonably screen and/or stabilized for discharge and I doubt any other medical condition or other [facility] requiring further screening, evaluation, or treatment in the ED at this time prior to discharge.    Final Clinical Impressions(s) / ED Diagnoses   Final diagnoses:  Pelvic pain in female  BV (bacterial vaginosis)  Yeast infection    New Prescriptions New Prescriptions   METRONIDAZOLE (FLAGYL) 500 MG TABLET    Take 1 tablet (500 mg total) by mouth 2 (two) times daily. One po bid x 7 days     Melene Plan, DO 07/07/17 1115

## 2017-07-08 LAB — GC/CHLAMYDIA PROBE AMP (~~LOC~~) NOT AT ARMC
CHLAMYDIA, DNA PROBE: NEGATIVE
Neisseria Gonorrhea: NEGATIVE

## 2017-07-08 LAB — RPR: RPR Ser Ql: NONREACTIVE

## 2017-07-08 LAB — HIV ANTIBODY (ROUTINE TESTING W REFLEX): HIV Screen 4th Generation wRfx: NONREACTIVE

## 2017-07-21 MED FILL — metroNIDAZOLE 500 MG TABS: 500 | 7 days supply | Qty: 14 | Fill #0

## 2017-12-25 ENCOUNTER — Emergency Department (HOSPITAL_BASED_OUTPATIENT_CLINIC_OR_DEPARTMENT_OTHER)
Admission: EM | Admit: 2017-12-25 | Discharge: 2017-12-25 | Disposition: A | Discharge status: Home / Self Care | Payer: Medicaid Other | Attending: Physician Assistant | Admitting: Physician Assistant

## 2017-12-25 ENCOUNTER — Encounter (HOSPITAL_BASED_OUTPATIENT_CLINIC_OR_DEPARTMENT_OTHER): Payer: Self-pay | Admitting: *Deleted

## 2017-12-25 ENCOUNTER — Other Ambulatory Visit: Payer: Self-pay

## 2017-12-25 ENCOUNTER — Emergency Department (HOSPITAL_BASED_OUTPATIENT_CLINIC_OR_DEPARTMENT_OTHER): Payer: Medicaid Other

## 2017-12-25 DIAGNOSIS — Z79899 Other long term (current) drug therapy: Secondary | ICD-10-CM | POA: Diagnosis not present

## 2017-12-25 DIAGNOSIS — J45909 Unspecified asthma, uncomplicated: Secondary | ICD-10-CM | POA: Diagnosis not present

## 2017-12-25 DIAGNOSIS — Z91040 Latex allergy status: Secondary | ICD-10-CM | POA: Diagnosis not present

## 2017-12-25 DIAGNOSIS — I10 Essential (primary) hypertension: Secondary | ICD-10-CM | POA: Insufficient documentation

## 2017-12-25 DIAGNOSIS — J069 Acute upper respiratory infection, unspecified: Secondary | ICD-10-CM | POA: Diagnosis not present

## 2017-12-25 DIAGNOSIS — R05 Cough: Secondary | ICD-10-CM | POA: Diagnosis present

## 2017-12-25 HISTORY — DX: Unspecified asthma, uncomplicated: J45.909

## 2017-12-25 MED ORDER — DEXAMETHASONE SODIUM PHOSPHATE 10 MG/ML IJ SOLN
10.0000 mg | Freq: Once | INTRAMUSCULAR | Status: AC
  Administered 2017-12-25: 10 mg via INTRAMUSCULAR
  Filled 2017-12-25: qty 1

## 2017-12-25 MED ORDER — BENZONATATE 100 MG PO CAPS: 100.0000 mg | ORAL_CAPSULE | Freq: Three times a day (TID) | ORAL | 0 refills | Status: DC | PRN

## 2017-12-25 NOTE — ED Provider Notes (Signed)
[facility] EMERGENCY DEPARTMENT Provider Note   CSN: [id number] Arrival date & time: 12/25/17  1809     History   Chief Complaint Chief Complaint  Patient presents with  . URI    HPI Linda Archer is a 32 y.o. female.  HPI   32 year old female presenting with symptoms of upper respiratory infection.  Patient has cough, congestion.  Patient says she has asthma and has had shortness of breath less cough.  Patient says she has had similar things in the past and steroids really helped.  Patient has low-grade fever, no body aches.  Past Medical History:  Diagnosis Date  . Asthma   . GERD (gastroesophageal reflux disease)   . Hypercholesteremia   . Hypertension   . Mitral valve prolapse   . Ovarian cyst     Patient Active Problem List   Diagnosis Date Noted  . Thrombocytopenia (HCC) 09/20/2015  . 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 Anxiety 01/22/2015  . Allergic rhinitis, seasonal 01/22/2015  . Left knee injury 01/22/2015  . Billowing mitral valve 12/24/2014  . 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 No pertinent surgical history.  OB History    Gravida Para Term Preterm AB Living   2 2       2    SAB TAB Ectopic Multiple Live Births                   Home Medications    Prior to Admission medications   Medication Sig Start Date End Date Taking? Authorizing Provider  albuterol (PROVENTIL HFA;VENTOLIN HFA) 108 (90 Base) MCG/ACT inhaler Inhale 2 puffs into the lungs every 6 (six) hours as needed for wheezing or shortness of breath.    [provider]  albuterol (PROVENTIL) (5 MG/ML) 0.5% nebulizer solution Inhale by nebulization. Dilute 0.3425ml=1.25mg  to final volume of 3ml for inhalation every 3-4 hours as needed for wheezing    [provider]  benzonatate (TESSALON PERLES) 100 MG capsule Take 1 capsule (100 mg total) by mouth 3 (three) times daily as needed for cough. 12/25/17   Jannatul Wojdyla Lyn, MD   citalopram (CELEXA) 40 MG tablet Take 40 mg by mouth daily.    [provider]  Fluticasone Furoate-Vilanterol (BREO ELLIPTA IN) Inhale into the lungs.    [provider]  Montelukast Sodium (SINGULAIR PO) Take by mouth.    [provider]    Family History History reviewed. No pertinent family history.  Social History Social History   Tobacco Use  . Smoking status: Never Smoker  . Smokeless tobacco: Never Used  Substance Use Topics  . Alcohol use: No    Alcohol/week: 0.0 oz  . Drug use: No     Allergies   Latex; Codeine; and Escitalopram   Review of Systems Review of Systems  Constitutional: Positive for fatigue and fever. Negative for activity change.  HENT: Positive for congestion.   Respiratory: Positive for cough and shortness of breath.   Cardiovascular: Negative for chest pain.  Gastrointestinal: Negative for abdominal pain.  All other systems reviewed and are negative.    Physical Exam Updated Vital Signs BP (!) 129/92   Pulse 84   Temp 98.4 F (36.9 C) (Oral)   Resp 18   Ht 5' (1.524 m)   Wt 70.3 kg (155 lb)   LMP 12/16/2017   SpO2 95%   BMI 30.27 kg/m   Physical Exam  Constitutional: She is oriented to person, place, and time. She  appears well-developed and well-nourished.  HENT:  Head: Normocephalic and atraumatic.  Eyes: Right eye exhibits no discharge. Left eye exhibits no discharge.  Cardiovascular: Normal rate and regular rhythm.  No murmur heard. Pulmonary/Chest: Effort normal and breath sounds normal. No respiratory distress. She has no wheezes.  Abdominal: Soft. She exhibits no distension. There is no tenderness.  Neurological: She is oriented to person, place, and time.  Skin: Skin is warm and dry. She is not diaphoretic.  Psychiatric: She has a normal mood and affect.  Nursing note and vitals reviewed.    ED Treatments / Results  Labs (all labs ordered are listed, but only abnormal results are  displayed) Labs Reviewed - No data to display  EKG  EKG Interpretation None       Radiology Dg Chest 2 View  Result Date: 12/25/2017 CLINICAL DATA:  Cough, congestion, and fever for 2 days, headache, nausea, dizziness, history bronchitis, asthma, GERD, hypertension EXAM: CHEST  2 VIEW COMPARISON:  04/08/2016 FINDINGS: Normal heart size, mediastinal contours, and pulmonary vascularity. Lungs clear. No pleural effusion or pneumothorax. Bones unremarkable. IMPRESSION: Normal exam. Electronically Signed   By: Ulyses Southward M.D.   On: 12/25/2017 19:23    Procedures Procedures (including critical care time)  Medications Ordered in ED Medications  dexamethasone (DECADRON) injection 10 mg (not administered)     Initial Impression / Assessment and Plan / ED Course  I have reviewed the triage vital signs and the nursing notes.  Pertinent labs & imaging results that were available during my care of the patient were reviewed by me and considered in my medical decision making (see chart for details).      32 year old female presenting with symptoms of upper respiratory infection.  Patient has cough, congestion.  Patient says she has asthma and has had shortness of breath less cough.  Patient says she has had similar things in the past and steroids really helped.  Patient has low-grade fever, no body aches.  8:24 PM X-ray negative.  Will give a shot of steroids.  Patient has normal lung exam findings.  We will have her follow-up with primary care.  Suspect viral illness.  Final Clinical Impressions(s) / ED Diagnoses   Final diagnoses:  Viral upper respiratory tract infection    ED Discharge Orders        Ordered    benzonatate (TESSALON PERLES) 100 MG capsule  3 times daily PRN     12/25/17 2022       Abelino Derrick, MD 12/25/17 2024

## 2017-12-25 NOTE — ED Triage Notes (Signed)
Pt reports URI x 2 days.  Fever of 100.5 PTA

## 2017-12-25 NOTE — ED Notes (Signed)
EDP into room 

## 2017-12-25 NOTE — Discharge Instructions (Signed)
You were seen today with viral syndrome and cough.  We gave you a shot of steroids which should help.  Please watch your symptoms.  Return as needed.  We gave you some Tessalon Perles to help with your cough.

## 2017-12-25 NOTE — ED Notes (Signed)
Alert, NAD, calm, interactive, resps e/u, speaking in clear complete sentences, no dyspnea noted, skin W&D, VSS, c/o fever, cough, ILI sx including: HA, sore throat, body aches, (denies: NVD).

## 2018-01-25 ENCOUNTER — Other Ambulatory Visit: Payer: Self-pay

## 2018-01-25 ENCOUNTER — Emergency Department (HOSPITAL_BASED_OUTPATIENT_CLINIC_OR_DEPARTMENT_OTHER): Payer: Medicaid Other

## 2018-01-25 ENCOUNTER — Encounter (HOSPITAL_BASED_OUTPATIENT_CLINIC_OR_DEPARTMENT_OTHER): Payer: Self-pay | Admitting: Emergency Medicine

## 2018-01-25 ENCOUNTER — Emergency Department (HOSPITAL_BASED_OUTPATIENT_CLINIC_OR_DEPARTMENT_OTHER)
Admission: EM | Admit: 2018-01-25 | Discharge: 2018-01-25 | Disposition: A | Discharge status: Home / Self Care | Payer: Medicaid Other | Attending: Emergency Medicine | Admitting: Emergency Medicine

## 2018-01-25 DIAGNOSIS — B9689 Other specified bacterial agents as the cause of diseases classified elsewhere: Secondary | ICD-10-CM

## 2018-01-25 DIAGNOSIS — N83202 Unspecified ovarian cyst, left side: Secondary | ICD-10-CM

## 2018-01-25 DIAGNOSIS — Z91040 Latex allergy status: Secondary | ICD-10-CM | POA: Diagnosis not present

## 2018-01-25 DIAGNOSIS — I10 Essential (primary) hypertension: Secondary | ICD-10-CM | POA: Diagnosis not present

## 2018-01-25 DIAGNOSIS — N760 Acute vaginitis: Secondary | ICD-10-CM | POA: Insufficient documentation

## 2018-01-25 DIAGNOSIS — Z79899 Other long term (current) drug therapy: Secondary | ICD-10-CM | POA: Diagnosis not present

## 2018-01-25 DIAGNOSIS — R102 Pelvic and perineal pain: Secondary | ICD-10-CM | POA: Diagnosis present

## 2018-01-25 DIAGNOSIS — J45909 Unspecified asthma, uncomplicated: Secondary | ICD-10-CM | POA: Insufficient documentation

## 2018-01-25 DIAGNOSIS — R112 Nausea with vomiting, unspecified: Secondary | ICD-10-CM | POA: Insufficient documentation

## 2018-01-25 LAB — URINALYSIS, ROUTINE W REFLEX MICROSCOPIC
BILIRUBIN URINE: NEGATIVE
GLUCOSE, UA: NEGATIVE mg/dL
HGB URINE DIPSTICK: NEGATIVE
KETONES UR: NEGATIVE mg/dL
Leukocytes, UA: NEGATIVE
Nitrite: NEGATIVE
PROTEIN: NEGATIVE mg/dL
Specific Gravity, Urine: 1.02 (ref 1.005–1.030)
pH: 6 (ref 5.0–8.0)

## 2018-01-25 LAB — WET PREP, GENITAL
Sperm: NONE SEEN
Trich, Wet Prep: NONE SEEN
Yeast Wet Prep HPF POC: NONE SEEN

## 2018-01-25 LAB — PREGNANCY, URINE: PREG TEST UR: NEGATIVE

## 2018-01-25 MED ORDER — ONDANSETRON 4 MG PO TBDP: ORAL_TABLET | ORAL | 0 refills | Status: DC

## 2018-01-25 MED ORDER — METRONIDAZOLE 500 MG PO TABS: 500.0000 mg | ORAL_TABLET | Freq: Two times a day (BID) | ORAL | 0 refills | Status: DC

## 2018-01-25 MED ORDER — ONDANSETRON 4 MG PO TBDP
ORAL_TABLET | ORAL | Status: AC
  Filled 2018-01-25: qty 1

## 2018-01-25 MED ORDER — KETOROLAC TROMETHAMINE 30 MG/ML IJ SOLN
30.0000 mg | Freq: Once | INTRAMUSCULAR | Status: AC
  Administered 2018-01-25: 30 mg via INTRAVENOUS
  Filled 2018-01-25: qty 1

## 2018-01-25 MED ORDER — ONDANSETRON 4 MG PO TBDP
4.0000 mg | ORAL_TABLET | Freq: Once | ORAL | Status: AC
  Administered 2018-01-25: 4 mg via ORAL

## 2018-01-25 MED ORDER — MORPHINE SULFATE (PF) 4 MG/ML IV SOLN
4.0000 mg | Freq: Once | INTRAVENOUS | Status: AC
  Administered 2018-01-25: 4 mg via INTRAVENOUS
  Filled 2018-01-25: qty 1

## 2018-01-25 MED ORDER — HYDROCODONE-ACETAMINOPHEN 5-325 MG PO TABS: 1.0000 | ORAL_TABLET | ORAL | 0 refills | Status: DC | PRN

## 2018-01-25 MED ORDER — NAPROXEN 500 MG PO TABS: 500.0000 mg | ORAL_TABLET | Freq: Two times a day (BID) | ORAL | 0 refills | Status: DC

## 2018-01-25 MED ORDER — FENTANYL CITRATE (PF) 100 MCG/2ML IJ SOLN
50.0000 ug | Freq: Once | INTRAMUSCULAR | Status: AC
  Administered 2018-01-25: 50 ug via INTRAVENOUS
  Filled 2018-01-25: qty 2

## 2018-01-25 NOTE — ED Notes (Signed)
Patient to the nurse first desk and asking about wait times. The patient given brief information

## 2018-01-25 NOTE — ED Provider Notes (Signed)
[facility] EMERGENCY DEPARTMENT Provider Note   CSN: [id number] Arrival date & time: 01/25/18  1424     History   Chief Complaint Chief Complaint  Patient presents with  . Pelvic Pain    HPI Linda Archer is a 32 y.o. female.  Patient is a 32 year old female who presents with left-sided pelvic pain.  She reports a one-week history of worsening pain in her left pelvic area.  It radiates to her left flank.  She has had some associated nausea and today she had some vomiting.  She denies any urinary symptoms.  She has a little bit of clear visit vaginal discharge.  She has a prior history of ovarian cyst and is not sure if that is what is causing her current pain.  She denies any change in bowel habits.  No known fevers.  She has taken some over-the-counter medicines with no improvement in symptoms.      Past Medical History:  Diagnosis Date  . Asthma   . GERD (gastroesophageal reflux disease)   . Hypercholesteremia   . Hypertension   . Mitral valve prolapse   . Ovarian cyst     Patient Active Problem List   Diagnosis Date Noted  . Thrombocytopenia (HCC) 09/20/2015  . 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 Anxiety 01/22/2015  . Allergic rhinitis, seasonal 01/22/2015  . Left knee injury 01/22/2015  . Billowing mitral valve 12/24/2014  . 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 No pertinent surgical history.  OB History    Gravida Para Term Preterm AB Living   2 2       2    SAB TAB Ectopic Multiple Live Births                   Home Medications    Prior to Admission medications   Medication Sig Start Date End Date Taking? Authorizing Provider  albuterol (PROVENTIL HFA;VENTOLIN HFA) 108 (90 Base) MCG/ACT inhaler Inhale 2 puffs into the lungs every 6 (six) hours as needed for wheezing or shortness of breath.    [provider]  albuterol (PROVENTIL) (5 MG/ML) 0.5% nebulizer solution Inhale by nebulization. Dilute  0.4125ml=1.25mg  to final volume of 3ml for inhalation every 3-4 hours as needed for wheezing    [provider]  benzonatate (TESSALON PERLES) 100 MG capsule Take 1 capsule (100 mg total) by mouth 3 (three) times daily as needed for cough. 12/25/17   Mackuen, Courteney Lyn, MD  citalopram (CELEXA) 40 MG tablet Take 40 mg by mouth daily.    [provider]  Fluticasone Furoate-Vilanterol (BREO ELLIPTA IN) Inhale into the lungs.    [provider]  HYDROcodone-acetaminophen (NORCO/VICODIN) 5-325 MG tablet Take 1-2 tablets by mouth every 4 (four) hours as needed. 01/25/18   Rolan BuccoBelfi, Wyatt Galvan, MD  metroNIDAZOLE (FLAGYL) 500 MG tablet Take 1 tablet (500 mg total) by mouth 2 (two) times daily. One po bid x 7 days 01/25/18   Rolan BuccoBelfi, Kaiven Vester, MD  Montelukast Sodium (SINGULAIR PO) Take by mouth.    [provider]  naproxen (NAPROSYN) 500 MG tablet Take 1 tablet (500 mg total) by mouth 2 (two) times daily. 01/25/18   Rolan BuccoBelfi, Andreia Gandolfi, MD  ondansetron (ZOFRAN ODT) 4 MG disintegrating tablet 4mg  ODT q4 hours prn nausea/vomit 01/25/18   Rolan BuccoBelfi, Mako Pelfrey, MD    Family History History reviewed. No pertinent family history.  Social History Social History   Tobacco Use  . Smoking status: Never Smoker  . Smokeless tobacco:  Never Used  Substance Use Topics  . Alcohol use: No    Alcohol/week: 0.0 oz  . Drug use: No     Allergies   Latex; Codeine; and Escitalopram   Review of Systems Review of Systems  Constitutional: Negative for chills, diaphoresis, fatigue and fever.  HENT: Negative for congestion, rhinorrhea and sneezing.   Eyes: Negative.   Respiratory: Negative for cough, chest tightness and shortness of breath.   Cardiovascular: Negative for chest pain and leg swelling.  Gastrointestinal: Positive for abdominal pain, nausea and vomiting. Negative for blood in stool and diarrhea.  Genitourinary: Positive for pelvic pain and vaginal discharge. Negative for difficulty  urinating, flank pain, frequency, hematuria, vaginal bleeding and vaginal pain.  Musculoskeletal: Negative for arthralgias and back pain.  Skin: Negative for rash.  Neurological: Negative for dizziness, speech difficulty, weakness, numbness and headaches.     Physical Exam Updated Vital Signs BP 118/78 (BP Location: Right Arm)   Pulse 94   Temp 98.5 F (36.9 C) (Oral)   Resp 16   Ht 5\' 3"  (1.6 m)   Wt 69.9 kg (154 lb)   LMP 01/14/2018   SpO2 98%   BMI 27.28 kg/m   Physical Exam  Constitutional: She is oriented to person, place, and time. She appears well-developed and well-nourished.  HENT:  Head: Normocephalic and atraumatic.  Eyes: Pupils are equal, round, and reactive to light.  Neck: Normal range of motion. Neck supple.  Cardiovascular: Normal rate, regular rhythm and normal heart sounds.  Pulmonary/Chest: Effort normal and breath sounds normal. No respiratory distress. She has no wheezes. She has no rales. She exhibits no tenderness.  Abdominal: Soft. Bowel sounds are normal. There is tenderness (Positive tenderness in the left lower abdomen). There is no rebound and no guarding.  Genitourinary:  Genitourinary Comments: Scant white vaginal discharge, no cervical motion tenderness, positive left adnexal tenderness  Musculoskeletal: Normal range of motion. She exhibits no edema.  Lymphadenopathy:    She has no cervical adenopathy.  Neurological: She is alert and oriented to person, place, and time.  Skin: Skin is warm and dry. No rash noted.  Psychiatric: She has a normal mood and affect.     ED Treatments / Results  Labs (all labs ordered are listed, but only abnormal results are displayed) Labs Reviewed  WET PREP, GENITAL - Abnormal; Notable for the following components:      Result Value   Clue Cells Wet Prep HPF POC PRESENT (*)    WBC, Wet Prep HPF POC MODERATE (*)    All other components within normal limits  PREGNANCY, URINE  URINALYSIS, ROUTINE W REFLEX  MICROSCOPIC  HIV ANTIBODY (ROUTINE TESTING)  GC/CHLAMYDIA PROBE AMP ([place]) NOT AT [facility]    EKG  EKG Interpretation None       Radiology US Transvaginal Non-ob  Result Date: 01/25/2018 CLINICAL DATA:  Initial evaluation for acute left adnexal pain for 1 week. EXAM: TRANSABDOMINAL AND TRANSVAGINAL ULTRASOUND OF PELVIS DOPPLER ULTRASOUND OF OVARIES TECHNIQUE: Both transabdominal and transvaginal ultrasound examinations of the pelvis were performed. Transabdominal technique was performed for global imaging of the pelvis including uterus, ovaries, adnexal regions, and pelvic cul-de-sac. It was necessary to proceed with endovaginal exam following the transabdominal exam to visualize the uterus, endometrium, and ovaries. Color and duplex Doppler ultrasound was utilized to evaluate blood flow to the ovaries. COMPARISON:  None. FINDINGS: Uterus Measurements: 7.3 x 4.4 x 4.8 cm. No fibroids or other mass visualized. Endometrium Thickness: 7 mm.  No focal abnormality visualized. Right ovary Measurements: 2.4 x 1.6 x 2.1 cm. Normal appearance/no adnexal mass. Left ovary Measurements: 3.5 x 1.7 x 1.8 cm. Normal appearance/no adnexal mass. Involuting corpus luteal cyst noted. Pulsed Doppler evaluation of both ovaries demonstrates normal low-resistance arterial and venous waveforms. Other findings Moderate volume free physiologic fluid, mildly complex with debris. IMPRESSION: 1. Involuting left ovarian corpus luteal cyst with associated moderate volume free physiologic fluid within the pelvis. 2. Otherwise unremarkable pelvic ultrasound. No other acute abnormality identified. No evidence for ovarian torsion. Electronically Signed   By: Rise Mu M.D.   On: 01/25/2018 18:30   US Pelvis Complete  Result Date: 01/25/2018 CLINICAL DATA:  Initial evaluation for acute left adnexal pain for 1 week. EXAM: TRANSABDOMINAL AND TRANSVAGINAL ULTRASOUND OF PELVIS DOPPLER ULTRASOUND OF OVARIES TECHNIQUE: Both  transabdominal and transvaginal ultrasound examinations of the pelvis were performed. Transabdominal technique was performed for global imaging of the pelvis including uterus, ovaries, adnexal regions, and pelvic cul-de-sac. It was necessary to proceed with endovaginal exam following the transabdominal exam to visualize the uterus, endometrium, and ovaries. Color and duplex Doppler ultrasound was utilized to evaluate blood flow to the ovaries. COMPARISON:  None. FINDINGS: Uterus Measurements: 7.3 x 4.4 x 4.8 cm. No fibroids or other mass visualized. Endometrium Thickness: 7 mm.  No focal abnormality visualized. Right ovary Measurements: 2.4 x 1.6 x 2.1 cm. Normal appearance/no adnexal mass. Left ovary Measurements: 3.5 x 1.7 x 1.8 cm. Normal appearance/no adnexal mass. Involuting corpus luteal cyst noted. Pulsed Doppler evaluation of both ovaries demonstrates normal low-resistance arterial and venous waveforms. Other findings Moderate volume free physiologic fluid, mildly complex with debris. IMPRESSION: 1. Involuting left ovarian corpus luteal cyst with associated moderate volume free physiologic fluid within the pelvis. 2. Otherwise unremarkable pelvic ultrasound. No other acute abnormality identified. No evidence for ovarian torsion. Electronically Signed   By: Rise Mu M.D.   On: 01/25/2018 18:30   [place] Art/ven Flow Abd Pelv Doppler  Result Date: 01/25/2018 CLINICAL DATA:  Initial evaluation for acute left adnexal pain for 1 week. EXAM: TRANSABDOMINAL AND TRANSVAGINAL ULTRASOUND OF PELVIS DOPPLER ULTRASOUND OF OVARIES TECHNIQUE: Both transabdominal and transvaginal ultrasound examinations of the pelvis were performed. Transabdominal technique was performed for global imaging of the pelvis including uterus, ovaries, adnexal regions, and pelvic cul-de-sac. It was necessary to proceed with endovaginal exam following the transabdominal exam to visualize the uterus, endometrium, and ovaries. Color and  duplex Doppler ultrasound was utilized to evaluate blood flow to the ovaries. COMPARISON:  None. FINDINGS: Uterus Measurements: 7.3 x 4.4 x 4.8 cm. No fibroids or other mass visualized. Endometrium Thickness: 7 mm.  No focal abnormality visualized. Right ovary Measurements: 2.4 x 1.6 x 2.1 cm. Normal appearance/no adnexal mass. Left ovary Measurements: 3.5 x 1.7 x 1.8 cm. Normal appearance/no adnexal mass. Involuting corpus luteal cyst noted. Pulsed Doppler evaluation of both ovaries demonstrates normal low-resistance arterial and venous waveforms. Other findings Moderate volume free physiologic fluid, mildly complex with debris. IMPRESSION: 1. Involuting left ovarian corpus luteal cyst with associated moderate volume free physiologic fluid within the pelvis. 2. Otherwise unremarkable pelvic ultrasound. No other acute abnormality identified. No evidence for ovarian torsion. Electronically Signed   By: Rise Mu M.D.   On: 01/25/2018 18:30    Procedures Procedures (including critical care time)  Medications Ordered in ED Medications  ondansetron (ZOFRAN-ODT) 4 MG disintegrating tablet (  Not Given 01/25/18 1715)  ondansetron (ZOFRAN-ODT) disintegrating tablet 4 mg (4 mg Oral Given  01/25/18 1715)  fentaNYL (SUBLIMAZE) injection 50 mcg (50 mcg Intravenous Given 01/25/18 1728)  morphine 4 MG/ML injection 4 mg (4 mg Intravenous Given 01/25/18 1859)  ketorolac (TORADOL) 30 MG/ML injection 30 mg (30 mg Intravenous Given 01/25/18 1856)     Initial Impression / Assessment and Plan / ED Course  I have reviewed the triage vital signs and the nursing notes.  Pertinent labs & imaging results that were available during my care of the patient were reviewed by me and considered in my medical decision making (see chart for details).     Patient is a 32 year old female who presents with left pelvic pain.  She had a slight amount of discharge and was positive for BV.  She will be treated with Flagyl for  this.  She had a pelvic ultrasound which showed an evolving left likely ruptured ovarian cyst with some fluid in the pelvis.  This likely is etiology for her symptoms.  There is no suggestions of ovarian torsion.  Her urinalysis does not appear to be consistent with infection.  Her pregnancy test is negative.  Her pain has been controlled in the emergency department with pain medications.  She was discharged home in good condition.  She was given a prescription for Naprosyn and Vicodin for pain.  She was also given some Zofran as well as the Flagyl.  She was encouraged to follow-up with her OB/GYN in [facility].  Return precautions were given.  Final Clinical Impressions(s) / ED Diagnoses   Final diagnoses:  BV (bacterial vaginosis)  Cyst of left ovary    ED Discharge Orders        Ordered    naproxen (NAPROSYN) 500 MG tablet  2 times daily     01/25/18 1940    HYDROcodone-acetaminophen (NORCO/VICODIN) 5-325 MG tablet  Every 4 hours PRN     01/25/18 1940    ondansetron (ZOFRAN ODT) 4 MG disintegrating tablet     01/25/18 1940    metroNIDAZOLE (FLAGYL) 500 MG tablet  2 times daily     01/25/18 1941       Rolan Bucco, MD 01/25/18 1942

## 2018-01-25 NOTE — ED Notes (Signed)
Small, clear,  no odor vaginal discharge for about a week.  Patient has been treating it with OTC med.

## 2018-01-25 NOTE — ED Triage Notes (Signed)
Patient states that she is having pain to her left pelvic region around to her left flank region. Patient states that she is having N/V now because of the pain

## 2018-01-25 NOTE — ED Notes (Signed)
Pt returned from U/S. She is having pain and request pain medication.

## 2018-01-26 LAB — GC/CHLAMYDIA PROBE AMP (~~LOC~~) NOT AT ARMC
CHLAMYDIA, DNA PROBE: NEGATIVE
NEISSERIA GONORRHEA: NEGATIVE

## 2018-01-26 LAB — HIV ANTIBODY (ROUTINE TESTING W REFLEX): HIV Screen 4th Generation wRfx: NONREACTIVE

## 2018-01-26 MED FILL — metroNIDAZOLE 500 MG TABS: 500 | 7 days supply | Qty: 14 | Fill #0

## 2018-01-26 MED FILL — NAPROXEN 500 MG TABLET: 500 | 15 days supply | Qty: 30 | Fill #0

## 2018-01-26 MED FILL — ONDANSETRON ODT 4 MG TABLET: 4 | 2 days supply | Qty: 4 | Fill #0

## 2018-01-26 MED FILL — HYDROCODON-APAP 5-325: 5-325 | 2 days supply | Qty: 10 | Fill #0

## 2018-07-06 ENCOUNTER — Encounter (HOSPITAL_BASED_OUTPATIENT_CLINIC_OR_DEPARTMENT_OTHER): Payer: Self-pay | Admitting: Emergency Medicine

## 2018-07-06 ENCOUNTER — Emergency Department (HOSPITAL_BASED_OUTPATIENT_CLINIC_OR_DEPARTMENT_OTHER)
Admission: EM | Admit: 2018-07-06 | Discharge: 2018-07-06 | Disposition: A | Discharge status: Home / Self Care | Payer: Medicaid Other | Attending: Emergency Medicine | Admitting: Emergency Medicine

## 2018-07-06 DIAGNOSIS — R109 Unspecified abdominal pain: Secondary | ICD-10-CM | POA: Insufficient documentation

## 2018-07-06 DIAGNOSIS — Z202 Contact with and (suspected) exposure to infections with a predominantly sexual mode of transmission: Secondary | ICD-10-CM | POA: Insufficient documentation

## 2018-07-06 DIAGNOSIS — I10 Essential (primary) hypertension: Secondary | ICD-10-CM | POA: Diagnosis not present

## 2018-07-06 DIAGNOSIS — J45909 Unspecified asthma, uncomplicated: Secondary | ICD-10-CM | POA: Diagnosis not present

## 2018-07-06 DIAGNOSIS — Z79899 Other long term (current) drug therapy: Secondary | ICD-10-CM | POA: Diagnosis not present

## 2018-07-06 LAB — URINALYSIS, MICROSCOPIC (REFLEX)

## 2018-07-06 LAB — URINALYSIS, ROUTINE W REFLEX MICROSCOPIC
BILIRUBIN URINE: NEGATIVE
GLUCOSE, UA: 100 mg/dL — AB
Hgb urine dipstick: NEGATIVE
Ketones, ur: NEGATIVE mg/dL
Leukocytes, UA: NEGATIVE
Nitrite: POSITIVE — AB
PROTEIN: 30 mg/dL — AB
Specific Gravity, Urine: 1.02 (ref 1.005–1.030)
pH: 5.5 (ref 5.0–8.0)

## 2018-07-06 LAB — WET PREP, GENITAL
Clue Cells Wet Prep HPF POC: NONE SEEN
SPERM: NONE SEEN
TRICH WET PREP: NONE SEEN
YEAST WET PREP: NONE SEEN

## 2018-07-06 LAB — PREGNANCY, URINE: PREG TEST UR: NEGATIVE

## 2018-07-06 MED ORDER — ONDANSETRON 8 MG PO TBDP
8.0000 mg | ORAL_TABLET | Freq: Once | ORAL | Status: AC
  Administered 2018-07-06: 8 mg via ORAL
  Filled 2018-07-06: qty 1

## 2018-07-06 MED ORDER — HYDROCODONE-ACETAMINOPHEN 5-325 MG PO TABS
2.0000 | ORAL_TABLET | Freq: Once | ORAL | Status: AC
  Administered 2018-07-06: 2 via ORAL
  Filled 2018-07-06: qty 2

## 2018-07-06 MED ORDER — HYDROCODONE-ACETAMINOPHEN 5-325 MG PO TABS: 1.0000 | ORAL_TABLET | Freq: Four times a day (QID) | ORAL | 0 refills | Status: AC | PRN

## 2018-07-06 NOTE — ED Triage Notes (Signed)
Pt c/o of left flank pain. Started approx 4 days ago and she has been trying otc meds without relief. Pt states hx of ovarian cysts to left side. Does endorse dysuria. No vaginal bleeding or discharge. Pt states she is nauseous but have not vomited

## 2018-07-06 NOTE — ED Provider Notes (Signed)
[facility] EMERGENCY DEPARTMENT Provider Note   CSN: [id number] Arrival date & time: 07/06/18  0546     History   Chief Complaint Chief Complaint  Patient presents with  . Flank Pain    HPI Linda Archer is a 32 y.o. female.  The history is provided by the patient.  Flank Pain  This is a new problem. The current episode started more than 2 days ago. The problem occurs daily. The problem has been gradually worsening. Associated symptoms include abdominal pain. Pertinent negatives include no shortness of breath. Exacerbated by: palpation. Nothing relieves the symptoms. Treatments tried: OTC meds. The treatment provided no relief.  Patient Presents for left flank pain for the past 4 days.  She reports trying over-the-counter medicines without improvement.  No Fevers or vomiting.  She does report nausea. She  Reports urinary frequency.  No new vaginal bleeding or discharge.  She has some lower abdominal pain as well.  She is concerned she may have an ovarian cyst  Past Medical History:  Diagnosis Date  . Asthma   . GERD (gastroesophageal reflux disease)   . Hypercholesteremia   . Hypertension   . Mitral valve prolapse   . Ovarian cyst     Patient Active Problem List   Diagnosis Date Noted  . Thrombocytopenia (HCC) 09/20/2015  . 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 Anxiety 01/22/2015  . Allergic rhinitis, seasonal 01/22/2015  . Left knee injury 01/22/2015  . Billowing mitral valve 12/24/2014  . Episodic paroxysmal anxiety disorder 12/24/2014    No past surgical history on file.   OB History    Gravida  2   Para  2   Term      Preterm      AB      Living  2     SAB      TAB      Ectopic      Multiple      Live Births               Home Medications    Prior to Admission medications   Medication Sig Start Date End Date Taking? Authorizing Provider  albuterol (PROVENTIL HFA;VENTOLIN HFA) 108 (90 Base) MCG/ACT inhaler Inhale 2 puffs  into the lungs every 6 (six) hours as needed for wheezing or shortness of breath.    [provider]  albuterol (PROVENTIL) (5 MG/ML) 0.5% nebulizer solution Inhale by nebulization. Dilute 0.4525ml=1.25mg  to final volume of 3ml for inhalation every 3-4 hours as needed for wheezing    [provider]  citalopram (CELEXA) 40 MG tablet Take 40 mg by mouth daily.    [provider]  Fluticasone Furoate-Vilanterol (BREO ELLIPTA IN) Inhale into the lungs.    [provider]  Montelukast Sodium (SINGULAIR PO) Take by mouth.    [provider]    Family History No family history on file.  Social History Social History   Tobacco Use  . Smoking status: Never Smoker  . Smokeless tobacco: Never Used  Substance Use Topics  . Alcohol use: No    Alcohol/week: 0.0 standard drinks  . Drug use: No     Allergies   Latex; Codeine; and Escitalopram   Review of Systems Review of Systems  Respiratory: Negative for shortness of breath.   Gastrointestinal: Positive for abdominal pain.  Genitourinary: Positive for flank pain and frequency. Negative for vaginal bleeding and vaginal discharge.  All other systems reviewed and are negative.    Physical  Exam Updated Vital Signs BP 119/83   Pulse 79   Temp 98.1 F (36.7 C) (Oral)   Resp 20   Ht 1.448 m (4\' 9" )   Wt 66.2 kg   SpO2 100%   BMI 31.59 kg/m   Physical Exam  CONSTITUTIONAL: Well developed/well nourished, uncomfortable appearing HEAD: Normocephalic/atraumatic EYES: EOMI/PERRL ENMT: Mucous membranes moist NECK: supple no meningeal signs SPINE/BACK:entire spine nontender CV: S1/S2 noted, no murmurs/rubs/gallops noted LUNGS: Lungs are clear to auscultation bilaterally, no apparent distress ABDOMEN: soft, mild LLQ tenderness, no rebound or guarding, bowel sounds noted throughout abdomen GU: Left Cva tenderness Pelvic exam-nurse chaperone present. No CMT, no vaginal bleeding, no discharge.   Minimal left adnexal tenderness, no right adnexal tenderness.  No masses noted NEURO: Pt is awake/alert/appropriate, moves all extremitiesx4.  No facial droop.   EXTREMITIES: pulses normal/equal, full ROM SKIN: warm, color normal PSYCH: no abnormalities of mood noted, alert and oriented to situation  ED Treatments / Results  Labs (all labs ordered are listed, but only abnormal results are displayed) Labs Reviewed  WET PREP, GENITAL - Abnormal; Notable for the following components:      Result Value   WBC, Wet Prep HPF POC FEW (*)    All other components within normal limits  URINALYSIS, ROUTINE W REFLEX MICROSCOPIC - Abnormal; Notable for the following components:   Color, Urine ORANGE (*)    APPearance HAZY (*)    Glucose, UA 100 (*)    Protein, ur 30 (*)    Nitrite POSITIVE (*)    All other components within normal limits  URINALYSIS, MICROSCOPIC (REFLEX) - Abnormal; Notable for the following components:   Bacteria, UA FEW (*)    All other components within normal limits  URINE CULTURE  PREGNANCY, URINE  GC/CHLAMYDIA PROBE AMP ([place]) NOT [facility]    EKG None  Radiology No results found.  Procedures Procedures  Medications Ordered in ED Medications  ondansetron (ZOFRAN-ODT) disintegrating tablet 8 mg (8 mg Oral Given 07/06/18 0615)  HYDROcodone-acetaminophen (NORCO/VICODIN) 5-325 MG per tablet 2 tablet (2 tablets Oral Given 07/06/18 0615)     Initial Impression / Assessment and Plan / ED Course  I have reviewed the triage vital signs and the nursing notes.  Pertinent labs results that were available during my care of the patient were reviewed by me and considered in my medical decision making (see chart for details).     Patient already feeling improved. She mostly has left flank pain, with mild left adnexal tenderness.  She has had multiple ultrasounds in the past that revealed ovarian cyst.  It is possible she has recurrent cyst.  She is clinically well and  nontoxic, my suspicion for ovarian torsion is low I do not feel emergent imaging required No convincing signs of UTI, urine culture sent Will discharge home with short course of pain medicines, with close GYN follow-up. Discussed strict return precautions  Final Clinical Impressions(s) / ED Diagnoses   Final diagnoses:  Left flank pain    ED Discharge Orders         Ordered    HYDROcodone-acetaminophen (NORCO/VICODIN) 5-325 MG tablet  Every 6 hours PRN     07/06/18 0715           Zadie RhineWickline, Arbadella Kimbler, MD 07/06/18 [phone]

## 2018-07-06 NOTE — Discharge Instructions (Addendum)

## 2018-07-07 LAB — URINE CULTURE: Culture: NO GROWTH

## 2018-07-07 LAB — GC/CHLAMYDIA PROBE AMP (~~LOC~~) NOT AT ARMC
CHLAMYDIA, DNA PROBE: NEGATIVE
NEISSERIA GONORRHEA: NEGATIVE

## 2018-09-10 ENCOUNTER — Emergency Department (HOSPITAL_BASED_OUTPATIENT_CLINIC_OR_DEPARTMENT_OTHER)
Admission: EM | Admit: 2018-09-10 | Discharge: 2018-09-10 | Disposition: A | Discharge status: Home / Self Care | Payer: Medicaid Other | Attending: Emergency Medicine | Admitting: Emergency Medicine

## 2018-09-10 ENCOUNTER — Other Ambulatory Visit: Payer: Self-pay

## 2018-09-10 ENCOUNTER — Emergency Department (HOSPITAL_BASED_OUTPATIENT_CLINIC_OR_DEPARTMENT_OTHER): Payer: Medicaid Other

## 2018-09-10 ENCOUNTER — Encounter (HOSPITAL_BASED_OUTPATIENT_CLINIC_OR_DEPARTMENT_OTHER): Payer: Self-pay | Admitting: Emergency Medicine

## 2018-09-10 DIAGNOSIS — K529 Noninfective gastroenteritis and colitis, unspecified: Secondary | ICD-10-CM | POA: Diagnosis not present

## 2018-09-10 DIAGNOSIS — Z79899 Other long term (current) drug therapy: Secondary | ICD-10-CM | POA: Insufficient documentation

## 2018-09-10 DIAGNOSIS — I10 Essential (primary) hypertension: Secondary | ICD-10-CM | POA: Diagnosis not present

## 2018-09-10 DIAGNOSIS — Z91040 Latex allergy status: Secondary | ICD-10-CM | POA: Insufficient documentation

## 2018-09-10 DIAGNOSIS — R1031 Right lower quadrant pain: Secondary | ICD-10-CM | POA: Diagnosis present

## 2018-09-10 DIAGNOSIS — J45909 Unspecified asthma, uncomplicated: Secondary | ICD-10-CM | POA: Insufficient documentation

## 2018-09-10 LAB — CBC WITH DIFFERENTIAL/PLATELET
Abs Immature Granulocytes: 0.04 10*3/uL (ref 0.00–0.07)
Basophils Absolute: 0 10*3/uL (ref 0.0–0.1)
Basophils Relative: 0 %
Eosinophils Absolute: 0.1 10*3/uL (ref 0.0–0.5)
Eosinophils Relative: 1 %
HCT: 46.1 % — ABNORMAL HIGH (ref 36.0–46.0)
Hemoglobin: 14.7 g/dL (ref 12.0–15.0)
Immature Granulocytes: 0 %
Lymphocytes Relative: 6 %
Lymphs Abs: 0.7 10*3/uL (ref 0.7–4.0)
MCH: 25.7 pg — ABNORMAL LOW (ref 26.0–34.0)
MCHC: 31.9 g/dL (ref 30.0–36.0)
MCV: 80.7 fL (ref 80.0–100.0)
Monocytes Absolute: 0.6 10*3/uL (ref 0.1–1.0)
Monocytes Relative: 5 %
Neutro Abs: 10.1 10*3/uL — ABNORMAL HIGH (ref 1.7–7.7)
Neutrophils Relative %: 88 %
Platelets: 220 10*3/uL (ref 150–400)
RBC: 5.71 MIL/uL — ABNORMAL HIGH (ref 3.87–5.11)
RDW: 13.2 % (ref 11.5–15.5)
WBC: 11.4 10*3/uL — ABNORMAL HIGH (ref 4.0–10.5)
nRBC: 0 % (ref 0.0–0.2)

## 2018-09-10 LAB — URINALYSIS, ROUTINE W REFLEX MICROSCOPIC
Bilirubin Urine: NEGATIVE
Glucose, UA: NEGATIVE mg/dL
HGB URINE DIPSTICK: NEGATIVE
KETONES UR: NEGATIVE mg/dL
Leukocytes, UA: NEGATIVE
NITRITE: NEGATIVE
Protein, ur: NEGATIVE mg/dL
Specific Gravity, Urine: 1.02 (ref 1.005–1.030)
pH: 7 (ref 5.0–8.0)

## 2018-09-10 LAB — COMPREHENSIVE METABOLIC PANEL
ALT: 9 U/L (ref 0–44)
AST: 13 U/L — ABNORMAL LOW (ref 15–41)
Albumin: 4.1 g/dL (ref 3.5–5.0)
Alkaline Phosphatase: 57 U/L (ref 38–126)
Anion gap: 6 (ref 5–15)
BUN: 9 mg/dL (ref 6–20)
CO2: 26 mmol/L (ref 22–32)
Calcium: 9 mg/dL (ref 8.9–10.3)
Chloride: 105 mmol/L (ref 98–111)
Creatinine, Ser: 0.68 mg/dL (ref 0.44–1.00)
GFR calc Af Amer: >60 mL/min (ref >60)
GFR calc non Af Amer: >60 mL/min (ref >60)
Glucose, Bld: 91 mg/dL (ref 70–99)
Potassium: 4 mmol/L (ref 3.5–5.1)
Sodium: 137 mmol/L (ref 135–145)
Total Bilirubin: 0.7 mg/dL (ref 0.3–1.2)
Total Protein: 7 g/dL (ref 6.5–8.1)

## 2018-09-10 LAB — PREGNANCY, URINE: PREG TEST UR: NEGATIVE

## 2018-09-10 LAB — LIPASE, BLOOD: Lipase: 33 U/L (ref 11–51)

## 2018-09-10 MED ORDER — IOPAMIDOL (ISOVUE-300) INJECTION 61%
100.0000 mL | Freq: Once | INTRAVENOUS | Status: AC | PRN
  Administered 2018-09-10: 100 mL via INTRAVENOUS

## 2018-09-10 MED ORDER — ONDANSETRON HCL 4 MG/2ML IJ SOLN
4.0000 mg | Freq: Once | INTRAMUSCULAR | Status: AC
  Administered 2018-09-10: 4 mg via INTRAVENOUS
  Filled 2018-09-10: qty 2

## 2018-09-10 MED ORDER — MORPHINE SULFATE (PF) 4 MG/ML IV SOLN
4.0000 mg | Freq: Once | INTRAVENOUS | Status: AC
  Administered 2018-09-10: 4 mg via INTRAVENOUS

## 2018-09-10 MED ORDER — MORPHINE SULFATE (PF) 4 MG/ML IV SOLN
INTRAVENOUS | Status: AC
  Filled 2018-09-10: qty 1

## 2018-09-10 MED ORDER — ONDANSETRON HCL 4 MG PO TABS: 4.0000 mg | ORAL_TABLET | Freq: Four times a day (QID) | ORAL | 0 refills | Status: DC

## 2018-09-10 NOTE — ED Notes (Signed)
ED Provider at bedside. 

## 2018-09-10 NOTE — Discharge Instructions (Addendum)
Please read attached information. If you experience any new or worsening signs or symptoms please return to the emergency room for evaluation. Please follow-up with your primary care provider or specialist as discussed. Please use medication prescribed only as directed and discontinue taking if you have any concerning signs or symptoms.   °

## 2018-09-10 NOTE — ED Provider Notes (Signed)
[facility] EMERGENCY DEPARTMENT Provider Note   CSN: [id number] Arrival date & time: 09/10/18  1027     History   Chief Complaint Chief Complaint  Patient presents with  . Abdominal Pain    HPI Linda Archer is a 32 y.o. female.  HPI  32 year old female presents today with complaints of abdominal pain.  Patient reports yesterday she felt slightly dizzy and weak.  She notes waking up this morning with intermittent abdominal pain.  She notes the pain is localized down into the right lower quadrant, she notes this has been coming in waves acutely painful at the time my evaluation.  She notes some nausea denies any vomiting or fever, denies any vaginal discharge bleeding or diarrhea.  Patient denies any urinary complaints.   Past Medical History:  Diagnosis Date  . Asthma   . GERD (gastroesophageal reflux disease)   . Hypercholesteremia   . Hypertension   . Mitral valve prolapse   . Ovarian cyst     Patient Active Problem List   Diagnosis Date Noted  . Thrombocytopenia (HCC) 09/20/2015  . 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 Anxiety 01/22/2015  . Allergic rhinitis, seasonal 01/22/2015  . Left knee injury 01/22/2015  . Billowing mitral valve 12/24/2014  . 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 No pertinent surgical history.   OB History    Gravida  2   Para  2   Term      Preterm      AB      Living  2     SAB      TAB      Ectopic      Multiple      Live Births               Home Medications    Prior to Admission medications   Medication Sig Start Date End Date Taking? Authorizing Provider  albuterol (PROVENTIL HFA;VENTOLIN HFA) 108 (90 Base) MCG/ACT inhaler Inhale 2 puffs into the lungs every 6 (six) hours as needed for wheezing or shortness of breath.    [provider]  albuterol (PROVENTIL) (5 MG/ML) 0.5% nebulizer solution Inhale by nebulization. Dilute 0.45ml=1.25mg  to final volume  of 3ml for inhalation every 3-4 hours as needed for wheezing    [provider]  citalopram (CELEXA) 40 MG tablet Take 40 mg by mouth daily.    [provider]  Fluticasone Furoate-Vilanterol (BREO ELLIPTA IN) Inhale into the lungs.    [provider]  HYDROcodone-acetaminophen (NORCO/VICODIN) 5-325 MG tablet Take 1 tablet by mouth every 6 (six) hours as needed. 07/06/18   Zadie Rhine, MD  Montelukast Sodium (SINGULAIR PO) Take by mouth.    [provider]  ondansetron (ZOFRAN) 4 MG tablet Take 1 tablet (4 mg total) by mouth every 6 (six) hours. 09/10/18   Eyvonne Mechanic, PA-C    Family History No family history on file.  Social History Social History   Tobacco Use  . Smoking status: Never Smoker  . Smokeless tobacco: Never Used  Substance Use Topics  . Alcohol use: No    Alcohol/week: 0.0 standard drinks  . Drug use: No     Allergies   Latex; Codeine; and Escitalopram   Review of Systems Review of Systems  All other systems reviewed and are negative.  Physical Exam Updated Vital Signs BP 121/76 (BP Location: Right Arm)   Pulse 94   Temp 98.9 F (37.2 C) (Oral)  Resp 16   Ht 4' 9.5" (1.461 m)   Wt 65.8 kg   LMP 08/17/2018   SpO2 100%   BMI 30.83 kg/m   Physical Exam  Constitutional: She is oriented to person, place, and time. She appears well-developed and well-nourished.  HENT:  Head: Normocephalic and atraumatic.  Eyes: Pupils are equal, round, and reactive to light. Conjunctivae are normal. Right eye exhibits no discharge. Left eye exhibits no discharge. No scleral icterus.  Neck: Normal range of motion. No JVD present. No tracheal deviation present.  Pulmonary/Chest: Effort normal. No stridor.  Abdominal:  Tenderness palpation right lower mid abdomen, remainder abdomen soft nontender  Neurological: She is alert and oriented to person, place, and time. Coordination normal.  Psychiatric: She has a normal mood and  affect. Her behavior is normal. Judgment and thought content normal.  Nursing note and vitals reviewed.    ED Treatments / Results  Labs (all labs ordered are listed, but only abnormal results are displayed) Labs Reviewed  CBC WITH DIFFERENTIAL/PLATELET - Abnormal; Notable for the following components:      Result Value   WBC 11.4 (*)    RBC 5.71 (*)    HCT 46.1 (*)    MCH 25.7 (*)    Neutro Abs 10.1 (*)    All other components within normal limits  COMPREHENSIVE METABOLIC PANEL - Abnormal; Notable for the following components:   AST 13 (*)    All other components within normal limits  URINALYSIS, ROUTINE W REFLEX MICROSCOPIC  PREGNANCY, URINE  LIPASE, BLOOD    EKG None  Radiology Ct Abdomen Pelvis W Contrast  Result Date: 09/10/2018 CLINICAL DATA:  Right lower quadrant abdominal pain for the past 5 hours. One episode of diarrhea this morning. EXAM: CT ABDOMEN AND PELVIS WITH CONTRAST TECHNIQUE: Multidetector CT imaging of the abdomen and pelvis was performed using the standard protocol following bolus administration of intravenous contrast. CONTRAST:  ISOVUE-300 IOPAMIDOL (ISOVUE-300) INJECTION 61% COMPARISON:  01/18/2017. FINDINGS: Lower chest: Clear lung bases. Hepatobiliary: No focal liver abnormality is seen. No gallstones, gallbladder wall thickening, or biliary dilatation. Pancreas: Unremarkable. No pancreatic ductal dilatation or surrounding inflammatory changes. Spleen: Normal in size without focal abnormality. Adrenals/Urinary Tract: Adrenal glands are unremarkable. Kidneys are normal, without renal calculi, focal lesion, or hydronephrosis. Bladder is unremarkable. Stomach/Bowel: Mild-to-moderate diffuse wall thickening and enhancement involving distal small bowel in the right pelvis. This is less pronounced involving the terminal ileum. Normal appearing appendix and colon. Very small hiatal hernia. Vascular/Lymphatic: No significant vascular findings are present. No  enlarged abdominal or pelvic lymph nodes. Reproductive: Uterus and bilateral adnexa are unremarkable. Other: Tiny umbilical hernia containing fat. Musculoskeletal: Transitional lumbosacral vertebra with a pseudoarticulation with the sacrum on the right. IMPRESSION: 1. Mild-to-moderate diffuse wall thickening and enhancement involving distal small bowel in the right pelvis. This is less pronounced involving the terminal ileum. This is compatible with infectious or inflammatory enteritis. 2. Very small hiatal hernia. Electronically Signed   By: Beckie Salts M.D.   On: 09/10/2018 12:06    Procedures Procedures (including critical care time)  Medications Ordered in ED Medications  iopamidol (ISOVUE-300) 61 % injection 100 mL (100 mLs Intravenous Contrast Given 09/10/18 1131)  ondansetron (ZOFRAN) injection 4 mg (4 mg Intravenous Given 09/10/18 1140)  morphine 4 MG/ML injection 4 mg (4 mg Intravenous Given 09/10/18 1209)    Initial Impression / Assessment and Plan / ED Course  I have reviewed the triage vital signs and the nursing notes.  Pertinent labs & imaging results that were available during my care of the patient were reviewed by me and considered in my medical decision making (see chart for details).    Labs: CBC, CMP, lipase, UA, urine pregnancy  Imaging: CT abdomen pelvis  Consults:  Therapeutics:  Discharge Meds:   Assessment/Plan:   32 year old female presents today with likely viral gastroenteritis.  Patient did have right lower quadrant tenderness and complaining of severe pain, CT ordered to assess for appendicitis, no signs consistent with appendicitis.  Patient resting comfortably in exam bed, tolerating p.o. afebrile.  She was discharged home with antinausea medication, outpatient follow-up with strict return precautions.  She verbalized understanding and agreement to today's plan.   Final Clinical Impressions(s) / ED Diagnoses   Final diagnoses:  Gastroenteritis     ED Discharge Orders         Ordered    ondansetron (ZOFRAN) 4 MG tablet  Every 6 hours     09/10/18 1236           Eyvonne Mechanic, PA-C 09/10/18 1238    Jacalyn Lefevre, MD 09/10/18 1433

## 2018-09-10 NOTE — ED Triage Notes (Addendum)
Generalized abd pain x 2 days. C/o of associated nausea, dizziness, and 1 episode of diarrhea. Denies vaginal discharge. She states the pain comes and goes, no pain at this time.

## 2020-08-27 ENCOUNTER — Encounter (HOSPITAL_BASED_OUTPATIENT_CLINIC_OR_DEPARTMENT_OTHER): Payer: Self-pay | Admitting: Radiology

## 2020-08-27 ENCOUNTER — Emergency Department (HOSPITAL_BASED_OUTPATIENT_CLINIC_OR_DEPARTMENT_OTHER): Payer: 59

## 2020-08-27 ENCOUNTER — Other Ambulatory Visit: Payer: Self-pay

## 2020-08-27 ENCOUNTER — Emergency Department (HOSPITAL_BASED_OUTPATIENT_CLINIC_OR_DEPARTMENT_OTHER)
Admission: EM | Admit: 2020-08-27 | Discharge: 2020-08-27 | Disposition: A | Discharge status: Home / Self Care | Payer: 59 | Attending: Emergency Medicine | Admitting: Emergency Medicine

## 2020-08-27 DIAGNOSIS — R197 Diarrhea, unspecified: Secondary | ICD-10-CM | POA: Diagnosis not present

## 2020-08-27 DIAGNOSIS — Z91040 Latex allergy status: Secondary | ICD-10-CM | POA: Diagnosis not present

## 2020-08-27 DIAGNOSIS — J45909 Unspecified asthma, uncomplicated: Secondary | ICD-10-CM | POA: Insufficient documentation

## 2020-08-27 DIAGNOSIS — I10 Essential (primary) hypertension: Secondary | ICD-10-CM | POA: Diagnosis not present

## 2020-08-27 DIAGNOSIS — R109 Unspecified abdominal pain: Secondary | ICD-10-CM

## 2020-08-27 DIAGNOSIS — Z20822 Contact with and (suspected) exposure to covid-19: Secondary | ICD-10-CM | POA: Diagnosis not present

## 2020-08-27 DIAGNOSIS — Z7951 Long term (current) use of inhaled steroids: Secondary | ICD-10-CM | POA: Diagnosis not present

## 2020-08-27 DIAGNOSIS — R1030 Lower abdominal pain, unspecified: Secondary | ICD-10-CM | POA: Diagnosis not present

## 2020-08-27 DIAGNOSIS — Z79899 Other long term (current) drug therapy: Secondary | ICD-10-CM | POA: Insufficient documentation

## 2020-08-27 LAB — COMPREHENSIVE METABOLIC PANEL
ALT: 13 U/L (ref 0–44)
AST: 16 U/L (ref 15–41)
Albumin: 4.2 g/dL (ref 3.5–5.0)
Alkaline Phosphatase: 54 U/L (ref 38–126)
Anion gap: 9 (ref 5–15)
BUN: 10 mg/dL (ref 6–20)
CO2: 25 mmol/L (ref 22–32)
Calcium: 8.8 mg/dL — ABNORMAL LOW (ref 8.9–10.3)
Chloride: 104 mmol/L (ref 98–111)
Creatinine, Ser: 0.72 mg/dL (ref 0.44–1.00)
GFR, Estimated: >60 mL/min (ref >60)
Glucose, Bld: 92 mg/dL (ref 70–99)
Potassium: 4 mmol/L (ref 3.5–5.1)
Sodium: 138 mmol/L (ref 135–145)
Total Bilirubin: 0.8 mg/dL (ref 0.3–1.2)
Total Protein: 7.1 g/dL (ref 6.5–8.1)

## 2020-08-27 LAB — CBC WITH DIFFERENTIAL/PLATELET
Abs Immature Granulocytes: 0.04 10*3/uL (ref 0.00–0.07)
Basophils Absolute: 0 10*3/uL (ref 0.0–0.1)
Basophils Relative: 0 %
Eosinophils Absolute: 0 10*3/uL (ref 0.0–0.5)
Eosinophils Relative: 0 %
HCT: 42.3 % (ref 36.0–46.0)
Hemoglobin: 14 g/dL (ref 12.0–15.0)
Immature Granulocytes: 0 %
Lymphocytes Relative: 10 %
Lymphs Abs: 1.1 10*3/uL (ref 0.7–4.0)
MCH: 26.6 pg (ref 26.0–34.0)
MCHC: 33.1 g/dL (ref 30.0–36.0)
MCV: 80.4 fL (ref 80.0–100.0)
Monocytes Absolute: 0.4 10*3/uL (ref 0.1–1.0)
Monocytes Relative: 3 %
Neutro Abs: 10.2 10*3/uL — ABNORMAL HIGH (ref 1.7–7.7)
Neutrophils Relative %: 87 %
Platelets: 193 10*3/uL (ref 150–400)
RBC: 5.26 MIL/uL — ABNORMAL HIGH (ref 3.87–5.11)
RDW: 13.1 % (ref 11.5–15.5)
WBC: 11.9 10*3/uL — ABNORMAL HIGH (ref 4.0–10.5)
nRBC: 0 % (ref 0.0–0.2)

## 2020-08-27 LAB — RESPIRATORY PANEL BY RT PCR (FLU A&B, COVID)
Influenza A by PCR: NEGATIVE
Influenza B by PCR: NEGATIVE
SARS Coronavirus 2 by RT PCR: NEGATIVE

## 2020-08-27 LAB — URINALYSIS, ROUTINE W REFLEX MICROSCOPIC
Bilirubin Urine: NEGATIVE
Glucose, UA: NEGATIVE mg/dL
Hgb urine dipstick: NEGATIVE
Ketones, ur: NEGATIVE mg/dL
Leukocytes,Ua: NEGATIVE
Nitrite: NEGATIVE
Protein, ur: NEGATIVE mg/dL
Specific Gravity, Urine: 1.015 (ref 1.005–1.030)
pH: 6 (ref 5.0–8.0)

## 2020-08-27 LAB — LIPASE, BLOOD: Lipase: 29 U/L (ref 11–51)

## 2020-08-27 LAB — PREGNANCY, URINE: Preg Test, Ur: NEGATIVE

## 2020-08-27 MED ORDER — DICYCLOMINE HCL 10 MG/ML IM SOLN
20.0000 mg | Freq: Once | INTRAMUSCULAR | Status: AC
  Administered 2020-08-27: 20 mg via INTRAMUSCULAR
  Filled 2020-08-27: qty 2

## 2020-08-27 MED ORDER — DICYCLOMINE HCL 20 MG PO TABS: 20.0000 mg | ORAL_TABLET | Freq: Two times a day (BID) | ORAL | 0 refills | Status: AC

## 2020-08-27 MED ORDER — ONDANSETRON HCL 4 MG/2ML IJ SOLN
4.0000 mg | Freq: Once | INTRAMUSCULAR | Status: AC
  Administered 2020-08-27: 4 mg via INTRAVENOUS
  Filled 2020-08-27: qty 2

## 2020-08-27 MED ORDER — IOHEXOL 300 MG/ML  SOLN
100.0000 mL | Freq: Once | INTRAMUSCULAR | Status: AC | PRN
  Administered 2020-08-27: 100 mL via INTRAVENOUS

## 2020-08-27 MED ORDER — ONDANSETRON 4 MG PO TBDP: 4.0000 mg | ORAL_TABLET | Freq: Three times a day (TID) | ORAL | 0 refills | Status: DC | PRN

## 2020-08-27 MED ORDER — KETOROLAC TROMETHAMINE 15 MG/ML IJ SOLN
15.0000 mg | Freq: Once | INTRAMUSCULAR | Status: AC
  Administered 2020-08-27: 15 mg via INTRAVENOUS
  Filled 2020-08-27: qty 1

## 2020-08-27 NOTE — Discharge Instructions (Addendum)
Your symptoms seem consistent with likely a viral intestinal illness.  Your work-up has been reassuring.  You do have a cyst on your left ovary.  I would recommend taking the Zofran for nausea at home.  Bentyl for any abdominal cramping.  Motrin and Tylenol for pain.  If the diarrhea gets severe you can take over-the-counter Imodium.  Drink plenty of fluids stay hydrated.  Make sure you follow-up with a primary care doctor.  Return the other worsening symptoms.

## 2020-08-27 NOTE — ED Provider Notes (Signed)
[facility] EMERGENCY DEPARTMENT Provider Note   CSN: [id number] Arrival date & time: 08/27/20  1055     History Chief Complaint  Patient presents with  . Abdominal Pain    Linda Archer is a 34 y.o. female.  HPI 34 year old female with a past medical history significant for asthma, GERD, hypertension, ovarian cyst presents to the emergency department today for evaluation of abdominal pain. Patient reports that this morning she awoke from sleep with severe sharp suprapubic abdominal discomfort with associated nausea, vomiting and diarrhea. Patient reports the pain has been constant. She did take ibuprofen this morning without any significant relief of symptoms. Patient denies any chest pain or shortness of breath. Denies any cough, sore throat or fever. Does report some chills. She is vaccinated against COVID-19. No known sick contacts. Patient states that she is currently on her menstrual cycle. Denies any vaginal discharge. She is sexually active and has no concern for STD. Patient rates her pain a 10/10. No alleviating or aggravating factors.     Past Medical History:  Diagnosis Date  . Asthma   . GERD (gastroesophageal reflux disease)   . Hypercholesteremia   . Hypertension   . Mitral valve prolapse   . Ovarian cyst     Patient Active Problem List   Diagnosis Date Noted  . Thrombocytopenia (HCC) 09/20/2015  . 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 Anxiety 01/22/2015  . Allergic rhinitis, seasonal 01/22/2015  . Left knee injury 01/22/2015  . Billowing mitral valve 12/24/2014  . Episodic paroxysmal anxiety disorder 12/24/2014    No past surgical history on file.   OB History    Gravida  2   Para  2   Term      Preterm      AB      Living  2     SAB      TAB      Ectopic      Multiple      Live Births              No family history on file.  Social History   Tobacco Use  . Smoking status: Never Smoker  . Smokeless tobacco: Never  Used  Vaping Use  . Vaping Use: Never used  Substance Use Topics  . Alcohol use: No    Alcohol/week: 0.0 standard drinks  . Drug use: No    Home Medications Prior to Admission medications   Medication Sig Start Date End Date Taking? Authorizing Provider  albuterol (PROVENTIL HFA;VENTOLIN HFA) 108 (90 Base) MCG/ACT inhaler Inhale 2 puffs into the lungs every 6 (six) hours as needed for wheezing or shortness of breath.    [provider]  albuterol (PROVENTIL) (5 MG/ML) 0.5% nebulizer solution Inhale by nebulization. Dilute 0.29ml=1.25mg  to final volume of 11ml for inhalation every 3-4 hours as needed for wheezing    [provider]  citalopram (CELEXA) 40 MG tablet Take 40 mg by mouth daily.    [provider]  Fluticasone Furoate-Vilanterol (BREO ELLIPTA IN) Inhale into the lungs.    [provider]  HYDROcodone-acetaminophen (NORCO/VICODIN) 5-325 MG tablet Take 1 tablet by mouth every 6 (six) hours as needed. 07/06/18   Zadie Rhine, MD  Montelukast Sodium (SINGULAIR PO) Take by mouth.    [provider]  ondansetron (ZOFRAN) 4 MG tablet Take 1 tablet (4 mg total) by mouth every 6 (six) hours. 09/10/18   Eyvonne Mechanic, PA-C    Allergies    Latex,  Codeine, and Escitalopram  Review of Systems   Review of Systems  Constitutional: Positive for chills. Negative for fever.  HENT: Negative for congestion.   Eyes: Negative for discharge.  Respiratory: Negative for cough and shortness of breath.   Cardiovascular: Negative for chest pain.  Gastrointestinal: Positive for abdominal pain, diarrhea, nausea and vomiting.  Genitourinary: Positive for vaginal bleeding. Negative for difficulty urinating, dysuria, enuresis, flank pain, hematuria, urgency and vaginal discharge.  Musculoskeletal: Negative for myalgias.  Skin: Negative for color change.  Neurological: Negative for headaches.  Psychiatric/Behavioral: Negative for confusion.     Physical Exam Updated Vital Signs BP 131/90 (BP Location: Right Arm)   Pulse 78   Temp 98.6 F (37 C) (Oral)   Resp 18   Ht 5' (1.524 m)   Wt 70.3 kg   LMP 08/26/2020   SpO2 100%   BMI 30.27 kg/m   Physical Exam Vitals and nursing note reviewed.  Constitutional:      General: She is not in acute distress.    Appearance: She is well-developed. She is not ill-appearing or toxic-appearing.  HENT:     Head: Normocephalic and atraumatic.  Eyes:     General: No scleral icterus.       Right eye: No discharge.        Left eye: No discharge.  Cardiovascular:     Rate and Rhythm: Normal rate and regular rhythm.     Heart sounds: Normal heart sounds.  Pulmonary:     Effort: Pulmonary effort is normal. No respiratory distress.     Breath sounds: Normal breath sounds.  Abdominal:     General: Abdomen is flat. Bowel sounds are increased.     Palpations: Abdomen is soft.     Tenderness: There is generalized abdominal tenderness and tenderness in the suprapubic area. There is no right CVA tenderness, left CVA tenderness, guarding or rebound. Negative signs include Murphy's sign and McBurney's sign.  Musculoskeletal:        General: Normal range of motion.     Cervical back: Normal range of motion.  Skin:    General: Skin is warm and dry.     Capillary Refill: Capillary refill takes less than 2 seconds.     Coloration: Skin is not pale.  Neurological:     Mental Status: She is alert.  Psychiatric:        Behavior: Behavior normal.        Thought Content: Thought content normal.        Judgment: Judgment normal.     ED Results / Procedures / Treatments   Labs (all labs ordered are listed, but only abnormal results are displayed) Labs Reviewed  CBC WITH DIFFERENTIAL/PLATELET - Abnormal; Notable for the following components:      Result Value   WBC 11.9 (*)    RBC 5.26 (*)    Neutro Abs 10.2 (*)    All other components within normal limits  COMPREHENSIVE METABOLIC PANEL  - Abnormal; Notable for the following components:   Calcium 8.8 (*)    All other components within normal limits  RESPIRATORY PANEL BY RT PCR (FLU A&B, COVID)  LIPASE, BLOOD  URINALYSIS, ROUTINE W REFLEX MICROSCOPIC  PREGNANCY, URINE    EKG None  Radiology No results found.  Procedures Procedures (including critical care time)  Medications Ordered in ED Medications  ketorolac (TORADOL) 15 MG/ML injection 15 mg (has no administration in time range)  ondansetron (ZOFRAN) injection 4 mg (has no administration in  time range)  dicyclomine (BENTYL) injection 20 mg (has no administration in time range)    ED Course  I have reviewed the triage vital signs and the nursing notes.  Pertinent labs & imaging results that were available during my care of the patient were reviewed by me and considered in my medical decision making (see chart for details).    MDM Rules/Calculators/A&P                          34 year old presents the ER for evaluation of sharp suprapubic abdominal discomfort this morning with associated vomiting and diarrhea.  Vital signs reassuring.  Exam without peritoneal signs.  Labs and imaging reviewed.  Mild leukocytosis of 12,000.  No significant electrolyte abnormality.  UA shows no concern for infection.  Pregnancy test was negative.  COVID and flu test negative.  Normal lipase.  CT scan shows no acute finding but does note a left adnexal cyst that has been seen on prior imaging.  Approximately 5 cm.  Given patient's pain will obtain ultrasound to rule out ovarian torsion.  Ultrasound obtained does not show any signs of torsion to the left ovarian cyst.  Unable to visualize the right ovary however low suspicion for any right-sided pathology.  Patient symptoms likely secondary to enteritis.  Will treat with Bentyl and antiemetics.  Encourage PCP follow-up.  Pt is hemodynamically stable, in NAD, & able to ambulate in the ED. Evaluation does not show pathology that would  require ongoing emergent intervention or inpatient treatment. I explained the diagnosis to the patient. Pain has been managed & has no complaints prior to dc. Pt is comfortable with above plan and is stable for discharge at this time. All questions were answered prior to disposition. Strict return precautions for f/u to the ED were discussed. Encouraged follow up with PCP.  Final Clinical Impression(s) / ED Diagnoses Final diagnoses:  Abdominal pain    Rx / DC Orders ED Discharge Orders         Ordered    ondansetron (ZOFRAN ODT) 4 MG disintegrating tablet  Every 8 hours PRN,   Status:  Discontinued        08/27/20 1712    dicyclomine (BENTYL) 20 MG tablet  2 times daily        08/27/20 1712    ondansetron (ZOFRAN ODT) 4 MG disintegrating tablet  Every 8 hours PRN        08/27/20 1712           Rise Mu, PA-C 08/29/20 2256    Tilden Fossa, MD 09/05/20 [phone]

## 2020-08-27 NOTE — ED Triage Notes (Signed)
Pt arrives pov with c/o lower abdominal pain and diarrhea that started this morning. Pt denies fever.  Ambulatory to room

## 2020-08-27 NOTE — ED Notes (Signed)
Review D/C papers with pt, reviewed Rx with pt, pt states understanding, pt denies questions at this time. 

## 2022-01-06 ENCOUNTER — Encounter (HOSPITAL_BASED_OUTPATIENT_CLINIC_OR_DEPARTMENT_OTHER): Payer: Self-pay | Admitting: *Deleted

## 2022-01-06 ENCOUNTER — Other Ambulatory Visit: Payer: Self-pay

## 2022-01-06 ENCOUNTER — Emergency Department (HOSPITAL_BASED_OUTPATIENT_CLINIC_OR_DEPARTMENT_OTHER)
Admission: EM | Admit: 2022-01-06 | Discharge: 2022-01-06 | Disposition: A | Discharge status: Home / Self Care | Payer: 59 | Attending: Emergency Medicine | Admitting: Emergency Medicine

## 2022-01-06 DIAGNOSIS — R112 Nausea with vomiting, unspecified: Secondary | ICD-10-CM | POA: Diagnosis present

## 2022-01-06 DIAGNOSIS — R000 Tachycardia, unspecified: Secondary | ICD-10-CM | POA: Insufficient documentation

## 2022-01-06 DIAGNOSIS — K529 Noninfective gastroenteritis and colitis, unspecified: Secondary | ICD-10-CM | POA: Insufficient documentation

## 2022-01-06 DIAGNOSIS — D72829 Elevated white blood cell count, unspecified: Secondary | ICD-10-CM | POA: Insufficient documentation

## 2022-01-06 DIAGNOSIS — I10 Essential (primary) hypertension: Secondary | ICD-10-CM | POA: Insufficient documentation

## 2022-01-06 DIAGNOSIS — Z91040 Latex allergy status: Secondary | ICD-10-CM | POA: Insufficient documentation

## 2022-01-06 LAB — URINALYSIS, ROUTINE W REFLEX MICROSCOPIC
Glucose, UA: NEGATIVE mg/dL
Hgb urine dipstick: NEGATIVE
Ketones, ur: >=80 mg/dL — AB
Leukocytes,Ua: NEGATIVE
Nitrite: NEGATIVE
Protein, ur: NEGATIVE mg/dL
Specific Gravity, Urine: >1.03 — ABNORMAL HIGH (ref 1.005–1.030)
pH: 5 (ref 5.0–8.0)

## 2022-01-06 LAB — COMPREHENSIVE METABOLIC PANEL
ALT: 23 U/L (ref 0–44)
AST: 21 U/L (ref 15–41)
Albumin: 4.3 g/dL (ref 3.5–5.0)
Alkaline Phosphatase: 79 U/L (ref 38–126)
Anion gap: 8 (ref 5–15)
BUN: 11 mg/dL (ref 6–20)
CO2: 22 mmol/L (ref 22–32)
Calcium: 9.1 mg/dL (ref 8.9–10.3)
Chloride: 104 mmol/L (ref 98–111)
Creatinine, Ser: 0.61 mg/dL (ref 0.44–1.00)
GFR, Estimated: >60 mL/min (ref >60)
Glucose, Bld: 102 mg/dL — ABNORMAL HIGH (ref 70–99)
Potassium: 4 mmol/L (ref 3.5–5.1)
Sodium: 134 mmol/L — ABNORMAL LOW (ref 135–145)
Total Bilirubin: 1.3 mg/dL — ABNORMAL HIGH (ref 0.3–1.2)
Total Protein: 7.7 g/dL (ref 6.5–8.1)

## 2022-01-06 LAB — CBC
HCT: 45.1 % (ref 36.0–46.0)
Hemoglobin: 14.9 g/dL (ref 12.0–15.0)
MCH: 26 pg (ref 26.0–34.0)
MCHC: 33 g/dL (ref 30.0–36.0)
MCV: 78.6 fL — ABNORMAL LOW (ref 80.0–100.0)
Platelets: 215 10*3/uL (ref 150–400)
RBC: 5.74 MIL/uL — ABNORMAL HIGH (ref 3.87–5.11)
RDW: 13.2 % (ref 11.5–15.5)
WBC: 12.4 10*3/uL — ABNORMAL HIGH (ref 4.0–10.5)
nRBC: 0 % (ref 0.0–0.2)

## 2022-01-06 LAB — LIPASE, BLOOD: Lipase: 24 U/L (ref 11–51)

## 2022-01-06 LAB — PREGNANCY, URINE: Preg Test, Ur: NEGATIVE

## 2022-01-06 MED ORDER — METOCLOPRAMIDE HCL 5 MG/ML IJ SOLN
10.0000 mg | Freq: Once | INTRAMUSCULAR | Status: AC
  Administered 2022-01-06: 10 mg via INTRAVENOUS
  Filled 2022-01-06: qty 2

## 2022-01-06 MED ORDER — DIPHENHYDRAMINE HCL 50 MG/ML IJ SOLN
25.0000 mg | Freq: Once | INTRAMUSCULAR | Status: AC
  Administered 2022-01-06: 25 mg via INTRAVENOUS

## 2022-01-06 MED ORDER — SODIUM CHLORIDE 0.9 % IV BOLUS
1000.0000 mL | Freq: Once | INTRAVENOUS | Status: AC
  Administered 2022-01-06: 1000 mL via INTRAVENOUS

## 2022-01-06 MED ORDER — ONDANSETRON 8 MG PO TBDP: 8.0000 mg | ORAL_TABLET | Freq: Three times a day (TID) | ORAL | 0 refills | Status: AC | PRN

## 2022-01-06 MED ORDER — ONDANSETRON HCL 4 MG/2ML IJ SOLN
4.0000 mg | Freq: Once | INTRAMUSCULAR | Status: AC
  Administered 2022-01-06: 4 mg via INTRAVENOUS
  Filled 2022-01-06: qty 2

## 2022-01-06 MED ORDER — DIPHENHYDRAMINE HCL 50 MG/ML IJ SOLN
25.0000 mg | Freq: Once | INTRAMUSCULAR | Status: DC
Start: 2022-01-06 — End: 2022-01-06
  Filled 2022-01-06: qty 1

## 2022-01-06 NOTE — ED Notes (Signed)
Sprite given as po challenge

## 2022-01-06 NOTE — Discharge Instructions (Addendum)
Drink plenty of fluids at home.  Use the nausea medicine as needed to prevent vomiting.  Try to focus on drinking liquids, you can reintroduce bland food in your diet slowly.  As we discussed, I recommend letting the diarrhea pass this helps with a virus that her system.  If this is unreasonable you can take over-the-counter Imodium which is an antidiarrheal agent that will stop the diarrhea.  If the pain becomes more severe, localizes to 1 part of your abdomen, or things significantly worsen return back to the ED for additional evaluation.  I suspect this is a stomach bug and will pass in the next 24 hours.

## 2022-01-06 NOTE — ED Provider Notes (Signed)
[place] EMERGENCY DEPARTMENT Provider Note   CSN: [id number] Arrival date & time: 01/06/22  1655     History  Chief Complaint  Patient presents with   Abdominal Pain   Emesis    Linda Archer is a 36 y.o. female.   Abdominal Pain Associated symptoms: vomiting   Emesis Associated symptoms: abdominal pain    Patient with history of anxiety, hypertension, hypercholesterolemia presents due to abdominal pain, nausea and diarrhea.  Started acutely this morning, been having multiple episodes throughout the day with associated abdominal pain that started after the vomiting.  No blood or mucus in stools.  Was around her son 2 days ago who was having similar symptoms right before he visited.  No recent antibiotics, recent travel, new foods.belfi  Home Medications Prior to Admission medications   Medication Sig Start Date End Date Taking? Authorizing Provider  ondansetron (ZOFRAN-ODT) 8 MG disintegrating tablet Take 1 tablet (8 mg total) by mouth every 8 (eight) hours as needed. 01/06/22  Yes Sherrill Raring, PA-C  albuterol (PROVENTIL HFA;VENTOLIN HFA) 108 (90 Base) MCG/ACT inhaler Inhale 2 puffs into the lungs every 6 (six) hours as needed for wheezing or shortness of breath.    [provider]  albuterol (PROVENTIL) (5 MG/ML) 0.5% nebulizer solution Inhale by nebulization. Dilute 0.53ml=1.25mg  to final volume of 27ml for inhalation every 3-4 hours as needed for wheezing    [provider]  citalopram (CELEXA) 40 MG tablet Take 40 mg by mouth daily.    [provider]  dicyclomine (BENTYL) 20 MG tablet Take 1 tablet (20 mg total) by mouth 2 (two) times daily. 08/27/20   Doristine Devoid, PA-C  etonogestrel (NEXPLANON) 68 MG IMPL implant by Subdermal route.    [provider]  Fluticasone Furoate-Vilanterol (BREO ELLIPTA IN) Inhale into the lungs.    [provider]  HYDROcodone-acetaminophen (NORCO/VICODIN) 5-325 MG tablet Take 1  tablet by mouth every 6 (six) hours as needed. 07/06/18   Ripley Fraise, MD  Montelukast Sodium (SINGULAIR PO) Take by mouth.    [provider]      Allergies    Latex, Codeine, and Escitalopram    Review of Systems   Review of Systems  Gastrointestinal:  Positive for abdominal pain and vomiting.   Physical Exam Updated Vital Signs BP 117/67    Pulse (!) 107    Temp 98.3 F (36.8 C) (Oral)    Resp 16    Ht 5' (1.524 m)    Wt 61.2 kg    LMP 12/10/2021    SpO2 100%    BMI 26.37 kg/m  Physical Exam Vitals and nursing note reviewed. Exam conducted with a chaperone present.  Constitutional:      Appearance: Normal appearance.  HENT:     Head: Normocephalic and atraumatic.  Eyes:     General: No scleral icterus.       Right eye: No discharge.        Left eye: No discharge.     Extraocular Movements: Extraocular movements intact.     Pupils: Pupils are equal, round, and reactive to light.  Cardiovascular:     Rate and Rhythm: Regular rhythm. Tachycardia present.     Pulses: Normal pulses.     Heart sounds: Normal heart sounds. No murmur heard.   No friction rub. No gallop.  Pulmonary:     Effort: Pulmonary effort is normal. No respiratory distress.     Breath sounds: Normal breath sounds.  Abdominal:  General: Abdomen is flat. Bowel sounds are normal. There is no distension.     Palpations: Abdomen is soft.     Tenderness: There is generalized abdominal tenderness.  Skin:    General: Skin is warm and dry.     Coloration: Skin is not jaundiced.  Neurological:     Mental Status: She is alert. Mental status is at baseline.     Coordination: Coordination normal.    ED Results / Procedures / Treatments   Labs (all labs ordered are listed, but only abnormal results are displayed) Labs Reviewed  COMPREHENSIVE METABOLIC PANEL - Abnormal; Notable for the following components:      Result Value   Sodium 134 (*)    Glucose, Bld 102 (*)    Total Bilirubin 1.3 (*)     All other components within normal limits  CBC - Abnormal; Notable for the following components:   WBC 12.4 (*)    RBC 5.74 (*)    MCV 78.6 (*)    All other components within normal limits  URINALYSIS, ROUTINE W REFLEX MICROSCOPIC - Abnormal; Notable for the following components:   Specific Gravity, Urine >1.030 (*)    Bilirubin Urine SMALL (*)    Ketones, ur >=80 (*)    All other components within normal limits  LIPASE, BLOOD  PREGNANCY, URINE    EKG None  Radiology No results found.  Procedures Procedures    Medications Ordered in ED Medications  sodium chloride 0.9 % bolus 1,000 mL (0 mLs Intravenous Stopped 01/06/22 2110)  ondansetron (ZOFRAN) injection 4 mg (4 mg Intravenous Given 01/06/22 1950)  metoCLOPramide (REGLAN) injection 10 mg (10 mg Intravenous Given 01/06/22 2114)  diphenhydrAMINE (BENADRYL) injection 25 mg (25 mg Intravenous Given 01/06/22 2111)  sodium chloride 0.9 % bolus 1,000 mL (1,000 mLs Intravenous New Bag/Given 01/06/22 2218)    ED Course/ Medical Decision Making/ A&P                           Medical Decision Making Amount and/or Complexity of Data Reviewed Labs: ordered.  Risk Prescription drug management.   36 year old female presenting due to vomiting, diarrhea, diffuse abdominal pain.  Differential diagnosis includes but is not limited to dehydration, sepsis, gastroenteritis, colitis  Patient is diffuse tenderness on exam without any guarding.  She is tachycardic, suspect likely secondary to volume depletion given large volume losses today.  Will give fluids and antiemetics for now.  I reviewed laboratory work-up and independently reviewed results.  Patient does not show signs of AKI or gross electrolyte derangement.  She has a slight leukocytosis of 12.  Urine noted for bilirubin and ketonuria consistent with dehydration/reactive ketosis.  Patient is a little nauseated after Zofran so ordered Reglan and Benadryl.  On reevaluation abdomen  is still soft, heart rate is roughly unchanged.  She is not hypoxic, I viewed the rhythm strip which shows normal sinus rhythm with a rate of 107.  Other I did consider getting imaging, I have a higher suspicion that this is more acute gastroenteritis than insidious septic process.    Patient passed p.o.  Reports feeling improved although she is still having urgency for diarrhea.  Although I did consider imaging and additional work-up at this time I feel this is more likely a viral gastroenteritis and do not feel strongly that CT would be particularly beneficial.    I discussed this with the patient, she would prefer to return home and then return  back if she is unable to tolerate p.o. or if things worsen.  I think that is reasonable.        Final Clinical Impression(s) / ED Diagnoses Final diagnoses:  Gastroenteritis    Rx / DC Orders ED Discharge Orders          Ordered    ondansetron (ZOFRAN-ODT) 8 MG disintegrating tablet  Every 8 hours PRN        01/06/22 2311              Sherrill Raring, PA-C 01/06/22 2318    Malvin Johns, MD 01/06/22 [phone]

## 2022-01-06 NOTE — ED Triage Notes (Signed)
Abdominal pain, vomiting and diarrhea since this am.

## 2022-08-21 IMAGING — US US PELVIS COMPLETE TRANSABD/TRANSVAG W DUPLEX
1 series · 13 of 25 positions shown · non-contrast
Comparison: CT of same day.  March 30, 2019.

CLINICAL DATA: Pelvic pain.

EXAM:
TRANSABDOMINAL AND TRANSVAGINAL ULTRASOUND OF PELVIS
DOPPLER ULTRASOUND OF OVARIES
TECHNIQUE: Both transabdominal and transvaginal ultrasound examinations of the
pelvis were performed. Transabdominal technique was performed for
global imaging of the pelvis including uterus, ovaries, adnexal
regions, and pelvic cul-de-sac.
It was necessary to proceed with endovaginal exam following the
transabdominal exam to visualize the endometrium and ovaries. Color
and duplex Doppler ultrasound was utilized to evaluate blood flow to
the ovaries.

[Series 1: us pelvis complete transabd/transvag w duplex · 13 of 55 slices shown]
[im 1/55]
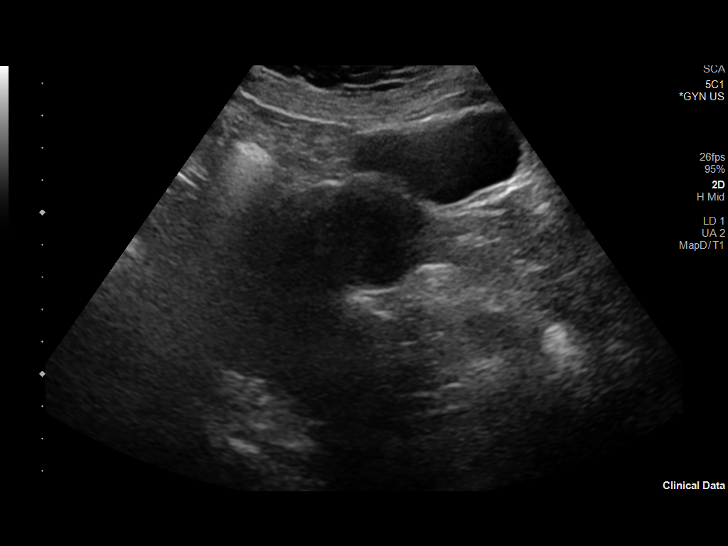
[im 5/55]
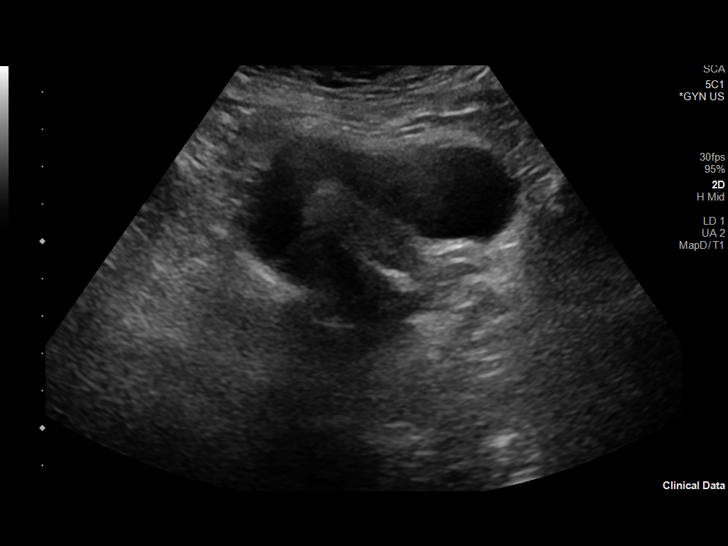
[im 10/55]
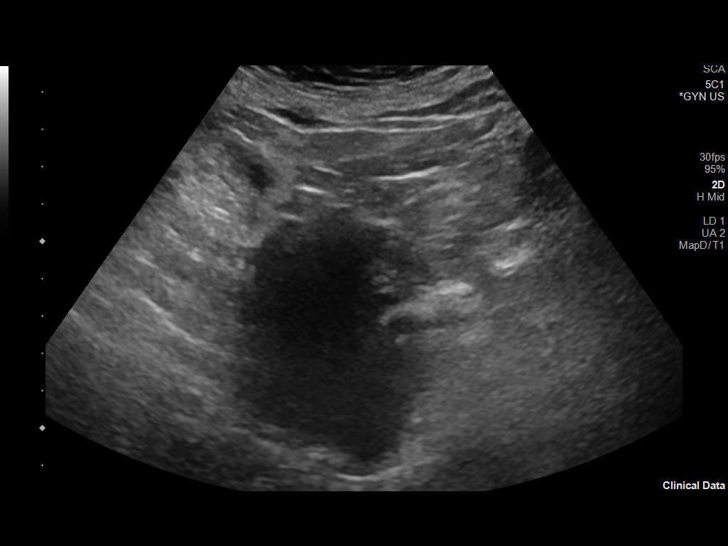
[im 14/55]
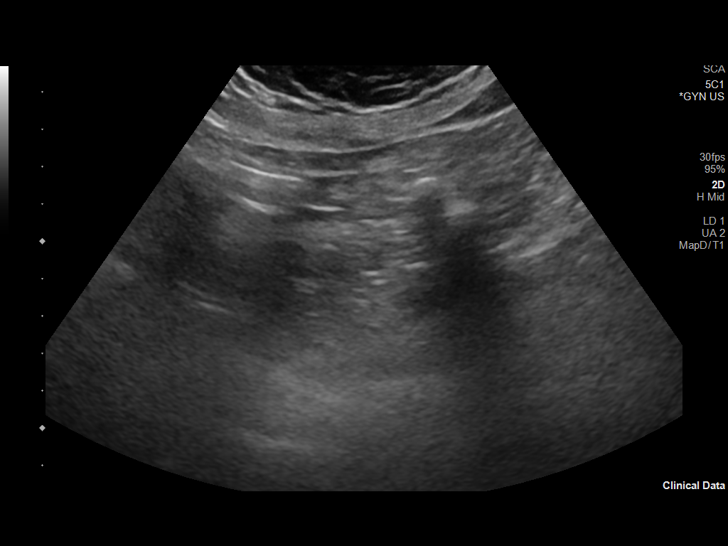
[im 19/55]
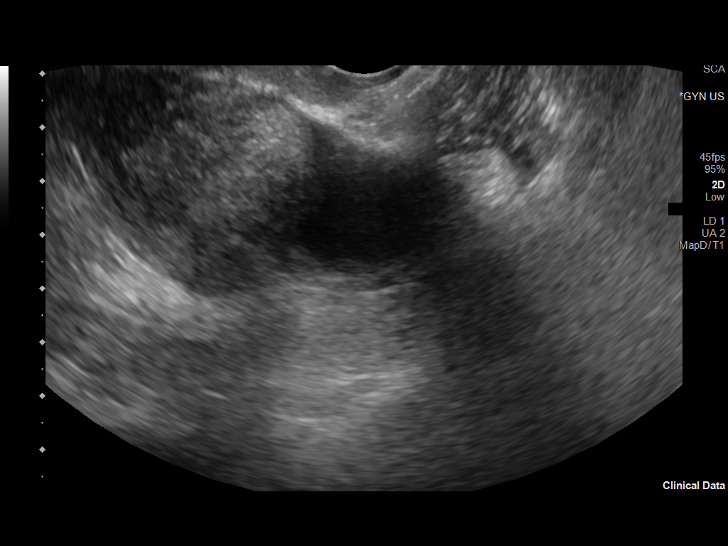
[im 23/55]
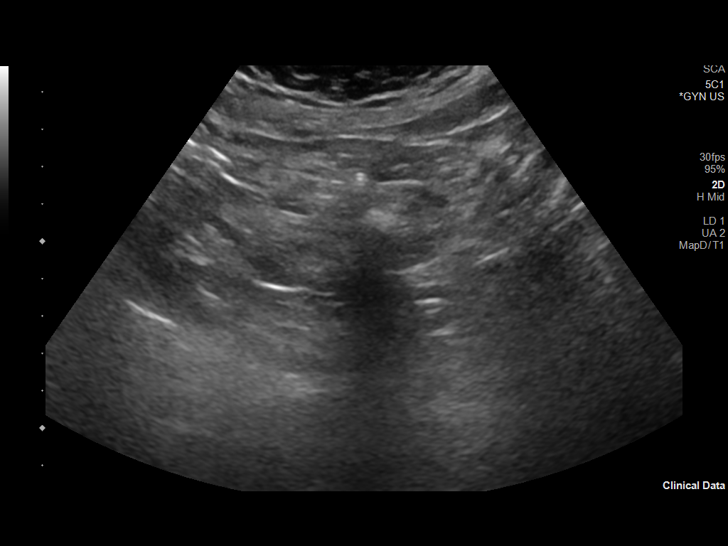
[im 28/55]
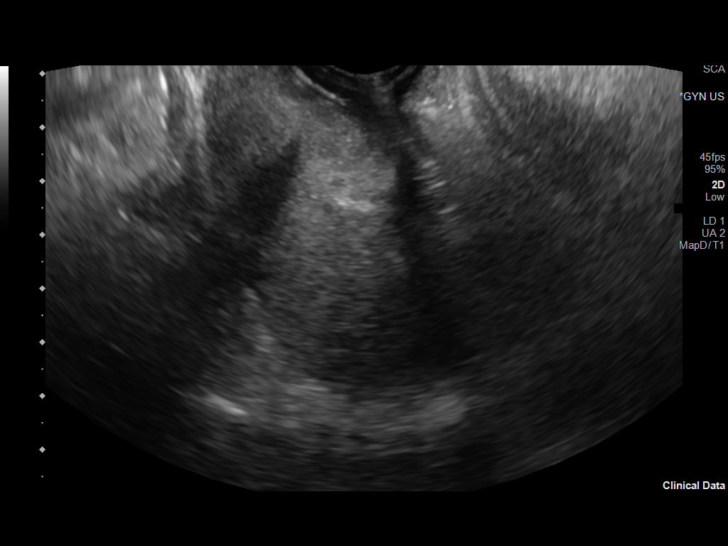
[im 32/55]
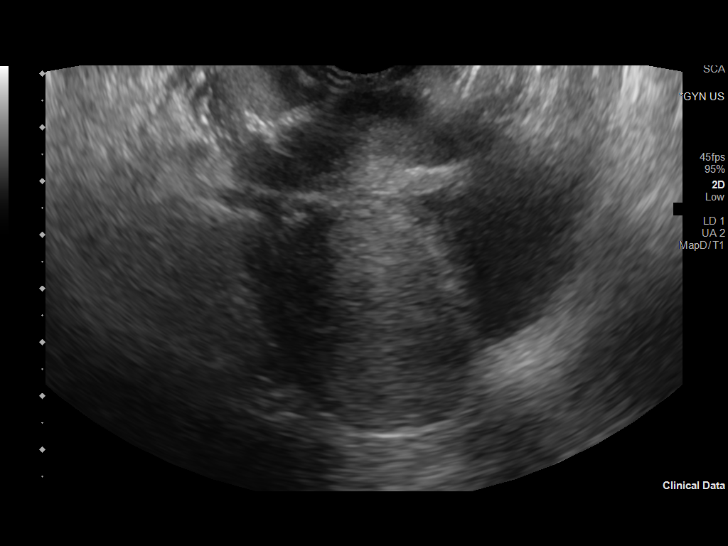
[im 37/55]
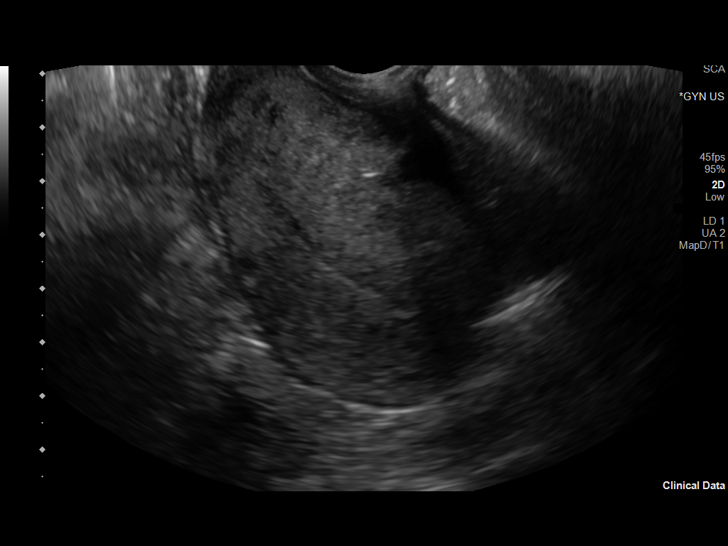
[im 41/55]
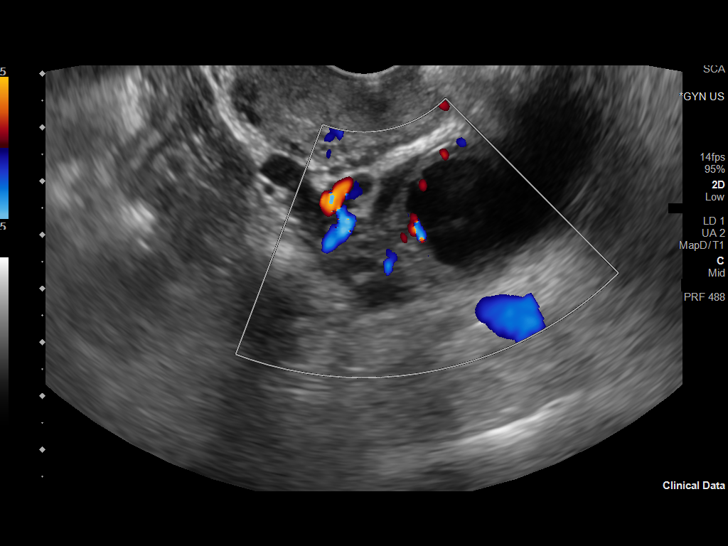
[im 46/55]
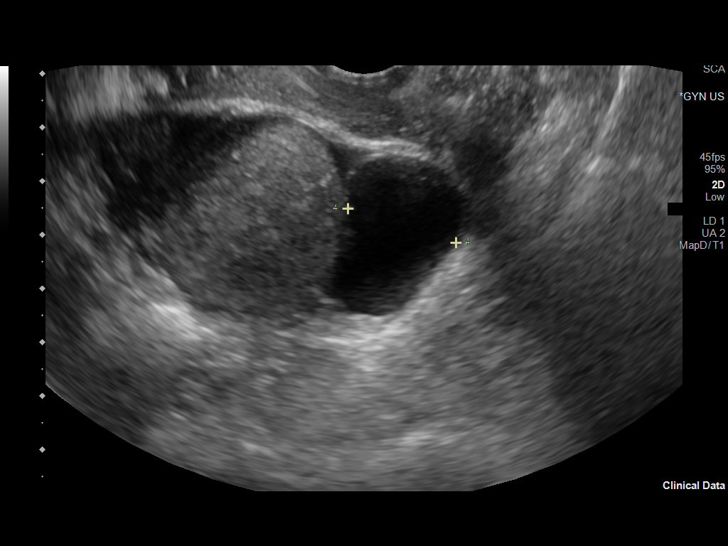
[im 50/55]
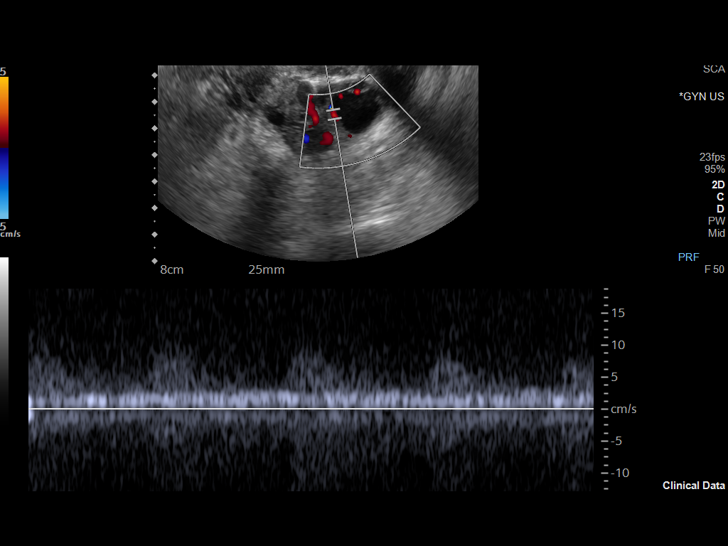
[im 55/55]
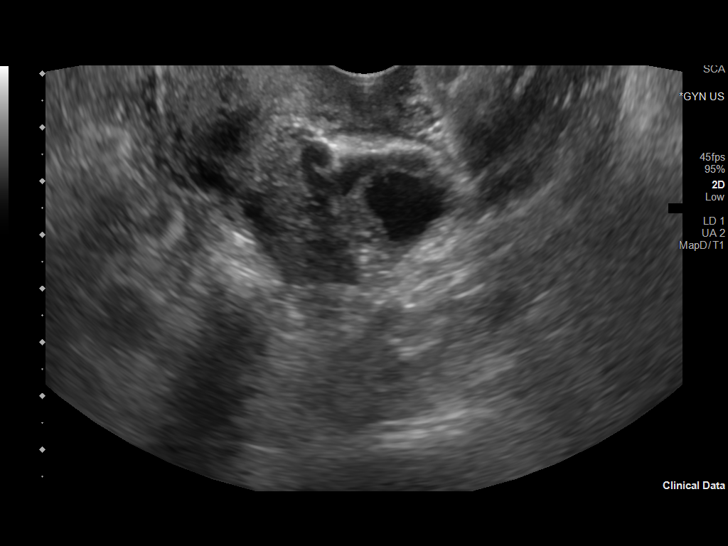

[13 of 25 positions shown; findings below may reference images not displayed]

FINDINGS: Uterus

Measurements: 6.8 x 4.1 x 3.9 cm = volume: 58 mL. No fibroids or
other mass visualized.

Endometrium

Thickness: 2 mm which is within normal limits. No focal abnormality
visualized.

Right ovary

Not visualized due to overlying bowel gas.

Left ovary

Measurements: 4.2 x 2.5 x 2.4 cm = volume: 13 mL. 3.6 cm left
ovarian cyst is noted.

Pulsed Doppler evaluation of left ovary demonstrates normal
low-resistance arterial and venous waveforms.

Other findings

No abnormal free fluid.
IMPRESSION: Right ovary is not visualized due to overlying bowel gas. 3.6 cm
left ovarian cyst is noted. This has benign characteristics and is a
common finding in premenopausal females. No imaging follow up is
required. This follows consensus guidelines: Simple Adnexal Cysts:
SRU Consensus Conference Update on Follow-up and Reporting.

## 2022-08-21 IMAGING — CT CT ABD-PELV W/ CM
2 of 4 series · 17 of 46 positions shown, 19 images · IV contrast (Omnipaque)
Comparison: December 2019

CLINICAL DATA: Lower abdominal pain with nausea and diarrhea

EXAM:
CT ABDOMEN AND PELVIS WITH CONTRAST
TECHNIQUE: Multidetector CT imaging of the abdomen and pelvis was performed
using the standard protocol following bolus administration of
intravenous contrast.
CONTRAST:  100mL OMNIPAQUE IOHEXOL 300 MG/ML  SOLN

[Series 2: axial st · axial · 0.93mm/px · z∈[-476,-66]mm · 14 of 90 slices shown, 16 images]
[im 4/90  soft-tissue]
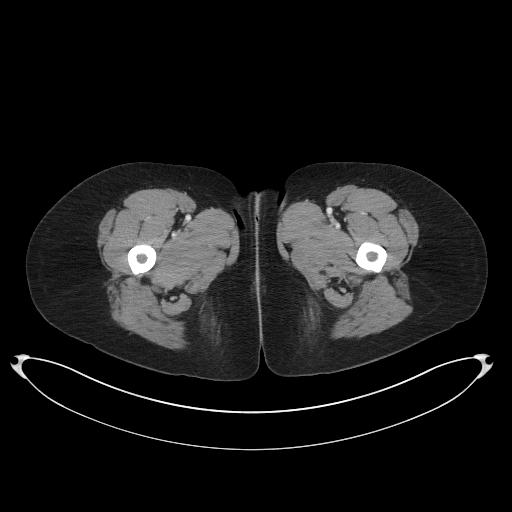
[im 4/90  bone]
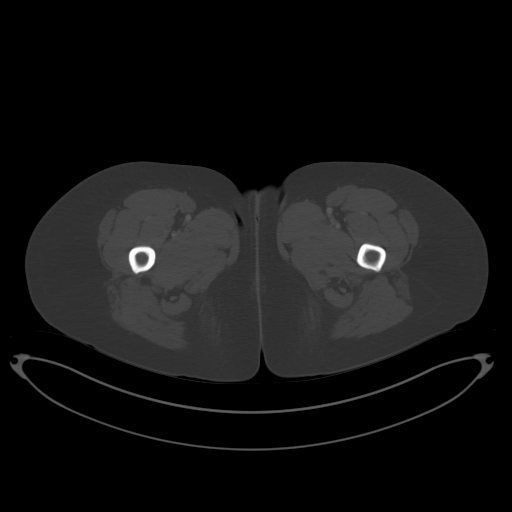
[im 11/90  soft-tissue]
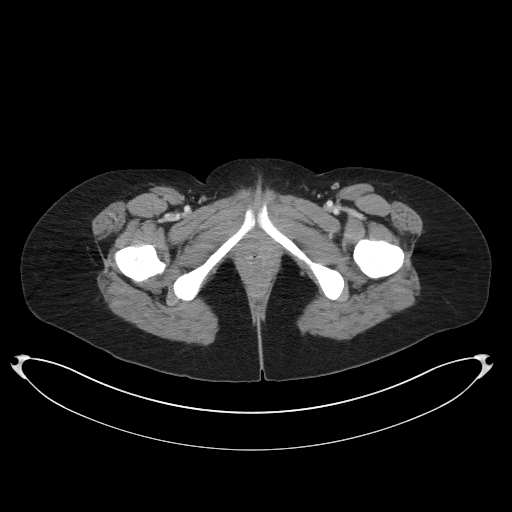
[im 18/90  soft-tissue]
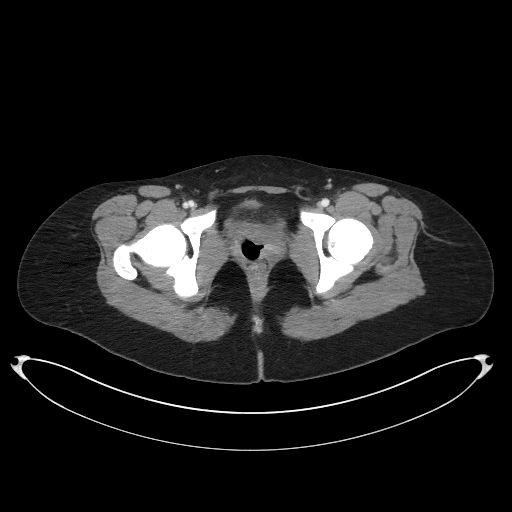
[im 24/90  soft-tissue]
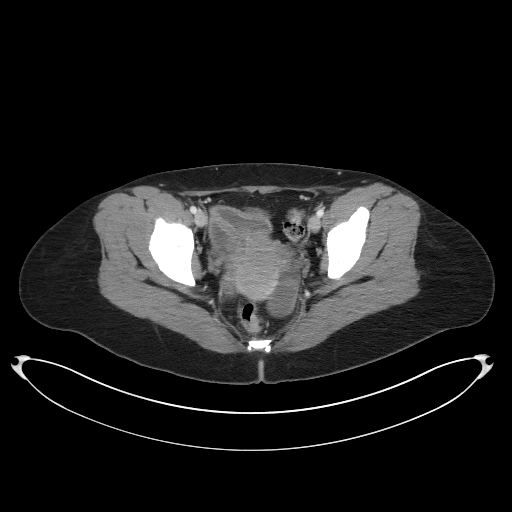
[im 31/90  soft-tissue]
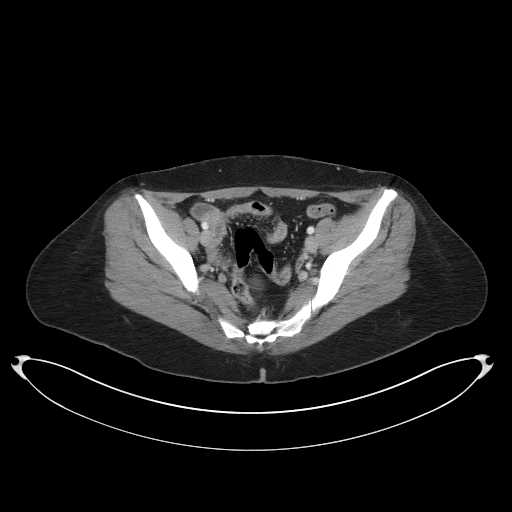
[im 35/90  soft-tissue]
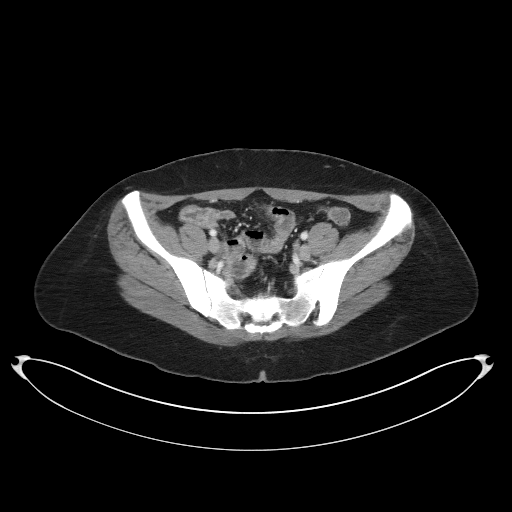
[im 42/90  soft-tissue]
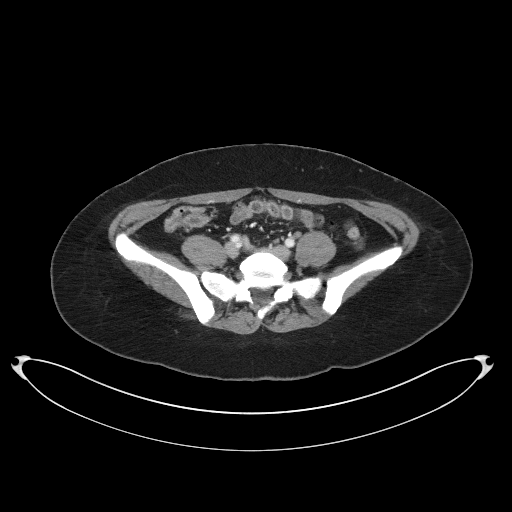
[im 48/90  soft-tissue]
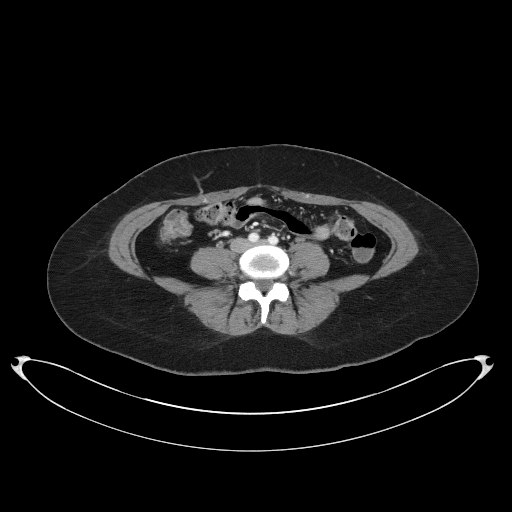
[im 55/90  soft-tissue]
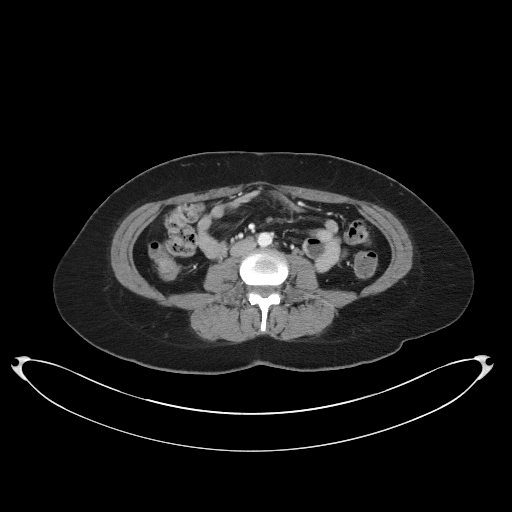
[im 55/90  bone]
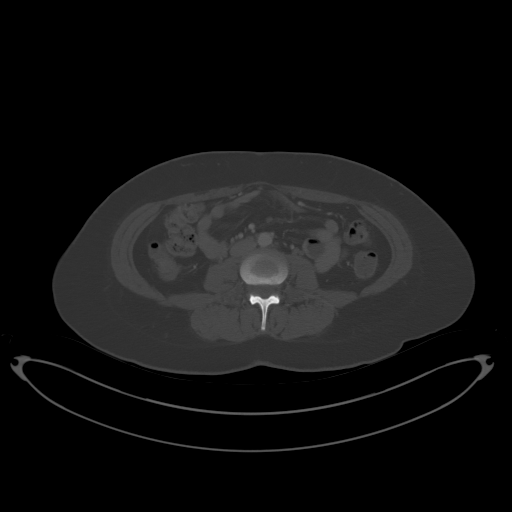
[im 59/90  soft-tissue]
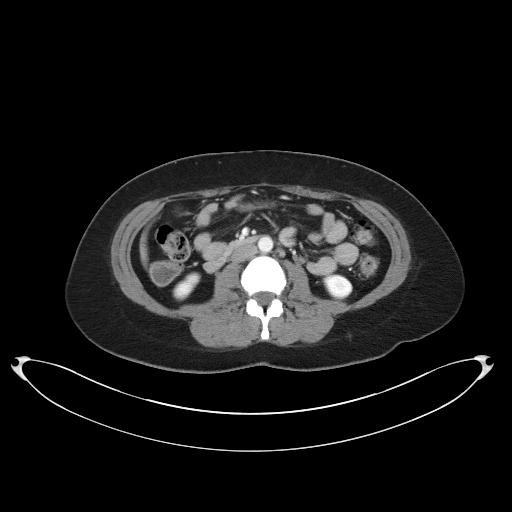
[im 66/90  soft-tissue]
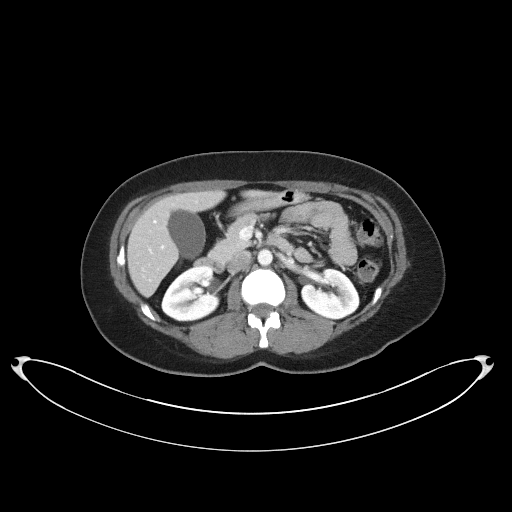
[im 72/90  soft-tissue]
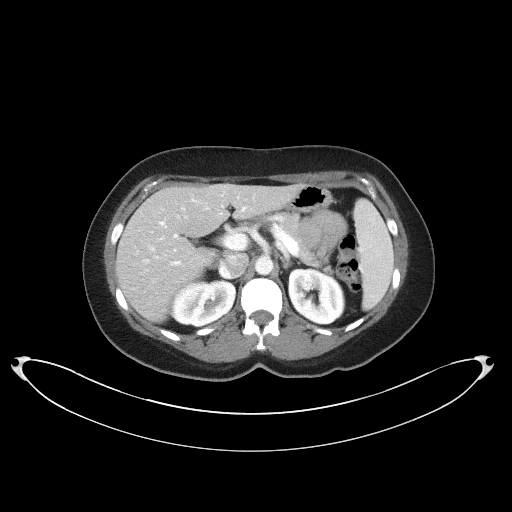
[im 79/90  soft-tissue]
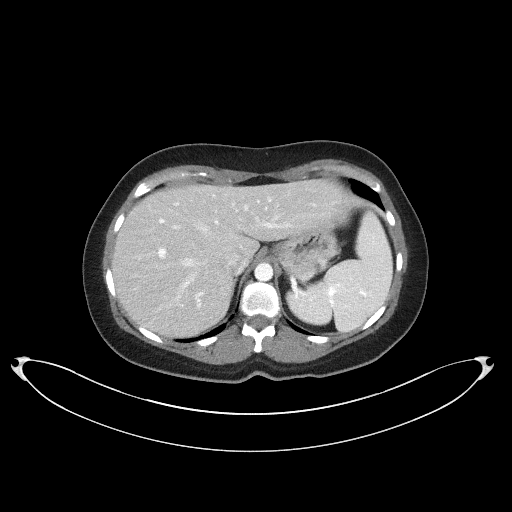
[im 86/90  soft-tissue]
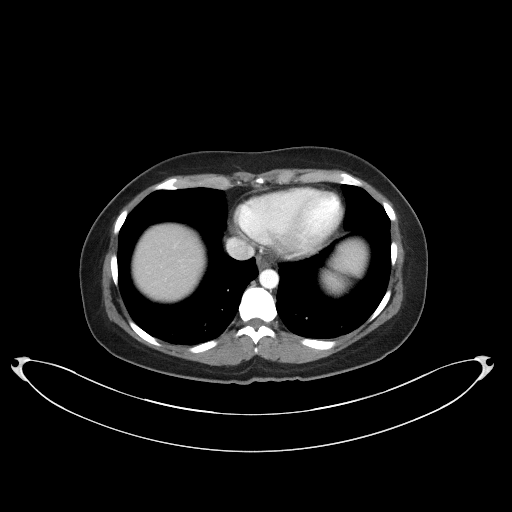

[Series 5: coronal st · coronal · 0.85mm/px · 3 of 75 slices shown]
[im 25/75  soft-tissue]
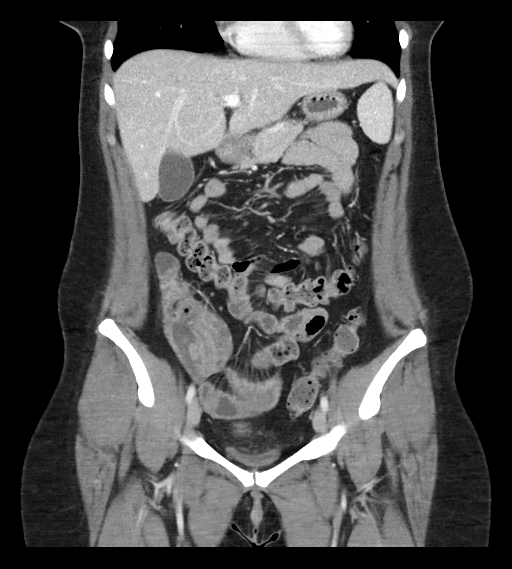
[im 33/75  soft-tissue]
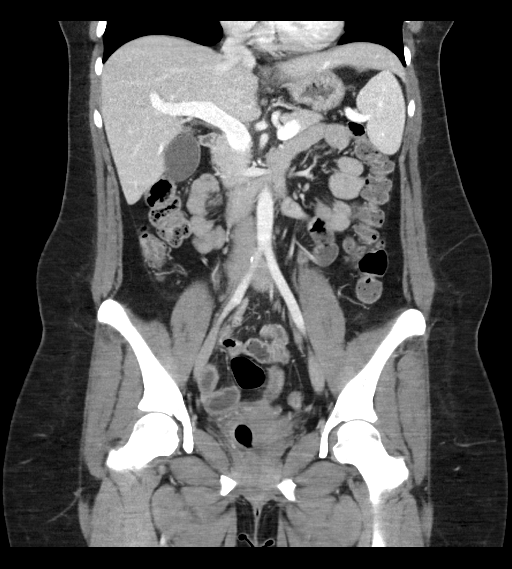
[im 42/75  soft-tissue]
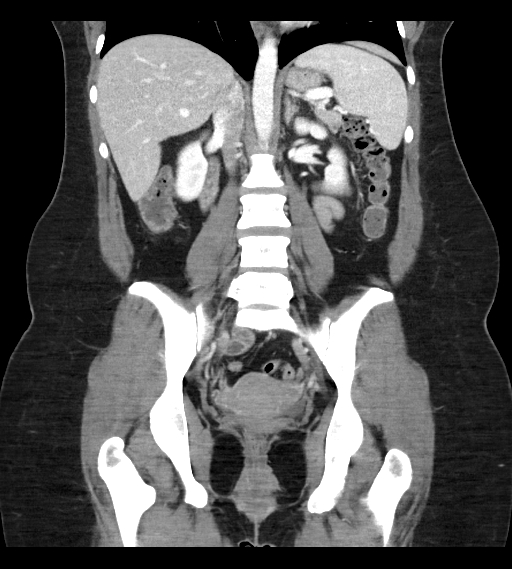

[17 of 46 positions shown; findings below may reference images not displayed]

FINDINGS: Lower chest: No acute abnormality.

Hepatobiliary: No focal liver abnormality is seen. No gallstones,
gallbladder wall thickening, or biliary dilatation.

Pancreas: Unremarkable.

Spleen: Unremarkable.

Adrenals/Urinary Tract: Adrenals are unremarkable. Kidneys are
unremarkable. The bladder is decompressed.

Stomach/Bowel: Stomach is within normal limits. Bowel is normal in
caliber. Normal appendix.

Vascular/Lymphatic: Trace aortic atherosclerosis. No enlarged lymph
nodes identified.

Reproductive: Uterus is unremarkable. Similar size of left adnexal
hypodense lesion measuring 4.3 cm (previously 3.8 cm).

Other: No ascites.  Abdominal wall is unremarkable.

Musculoskeletal: No acute osseous abnormality.
IMPRESSION: No acute abnormality. Similar size of left adnexal hypodense lesion
probably reflecting a cyst.

## 2024-10-10 ENCOUNTER — Emergency Department (HOSPITAL_BASED_OUTPATIENT_CLINIC_OR_DEPARTMENT_OTHER)
Admission: EM | Admit: 2024-10-10 | Discharge: 2024-10-10 | Disposition: A | Discharge status: Home / Self Care | Attending: Emergency Medicine | Admitting: Emergency Medicine

## 2024-10-10 ENCOUNTER — Emergency Department (HOSPITAL_BASED_OUTPATIENT_CLINIC_OR_DEPARTMENT_OTHER)

## 2024-10-10 ENCOUNTER — Encounter (HOSPITAL_BASED_OUTPATIENT_CLINIC_OR_DEPARTMENT_OTHER): Payer: Self-pay | Admitting: Emergency Medicine

## 2024-10-10 ENCOUNTER — Other Ambulatory Visit: Payer: Self-pay

## 2024-10-10 DIAGNOSIS — Z91040 Latex allergy status: Secondary | ICD-10-CM | POA: Insufficient documentation

## 2024-10-10 DIAGNOSIS — K529 Noninfective gastroenteritis and colitis, unspecified: Secondary | ICD-10-CM | POA: Insufficient documentation

## 2024-10-10 DIAGNOSIS — R1032 Left lower quadrant pain: Secondary | ICD-10-CM | POA: Diagnosis present

## 2024-10-10 LAB — WET PREP, GENITAL
Clue Cells Wet Prep HPF POC: NONE SEEN
Sperm: NONE SEEN
Trich, Wet Prep: NONE SEEN
WBC, Wet Prep HPF POC: <10 (ref <10)
Yeast Wet Prep HPF POC: NONE SEEN

## 2024-10-10 LAB — COMPREHENSIVE METABOLIC PANEL WITH GFR
ALT: 7 U/L (ref 0–44)
AST: 15 U/L (ref 15–41)
Albumin: 4.4 g/dL (ref 3.5–5.0)
Alkaline Phosphatase: 66 U/L (ref 38–126)
Anion gap: 11 (ref 5–15)
BUN: 6 mg/dL (ref 6–20)
CO2: 24 mmol/L (ref 22–32)
Calcium: 9 mg/dL (ref 8.9–10.3)
Chloride: 103 mmol/L (ref 98–111)
Creatinine, Ser: 0.61 mg/dL (ref 0.44–1.00)
GFR, Estimated: >60 mL/min (ref >60)
Glucose, Bld: 90 mg/dL (ref 70–99)
Potassium: 4.5 mmol/L (ref 3.5–5.1)
Sodium: 138 mmol/L (ref 135–145)
Total Bilirubin: 0.6 mg/dL (ref 0.0–1.2)
Total Protein: 6.8 g/dL (ref 6.5–8.1)

## 2024-10-10 LAB — CBC
HCT: 41.7 % (ref 36.0–46.0)
Hemoglobin: 13.9 g/dL (ref 12.0–15.0)
MCH: 26.8 pg (ref 26.0–34.0)
MCHC: 33.3 g/dL (ref 30.0–36.0)
MCV: 80.3 fL (ref 80.0–100.0)
Platelets: 214 K/uL (ref 150–400)
RBC: 5.19 MIL/uL — ABNORMAL HIGH (ref 3.87–5.11)
RDW: 12.6 % (ref 11.5–15.5)
WBC: 8.5 K/uL (ref 4.0–10.5)
nRBC: 0 % (ref 0.0–0.2)

## 2024-10-10 LAB — URINALYSIS, ROUTINE W REFLEX MICROSCOPIC
Bilirubin Urine: NEGATIVE
Glucose, UA: NEGATIVE mg/dL
Hgb urine dipstick: NEGATIVE
Ketones, ur: NEGATIVE mg/dL
Leukocytes,Ua: NEGATIVE
Nitrite: NEGATIVE
Protein, ur: NEGATIVE mg/dL
Specific Gravity, Urine: 1.02 (ref 1.005–1.030)
pH: 7 (ref 5.0–8.0)

## 2024-10-10 LAB — LIPASE, BLOOD: Lipase: 27 U/L (ref 11–51)

## 2024-10-10 LAB — PREGNANCY, URINE: Preg Test, Ur: NEGATIVE

## 2024-10-10 MED ORDER — ACETAMINOPHEN 500 MG PO TABS
1000.0000 mg | ORAL_TABLET | Freq: Once | ORAL | Status: AC
  Administered 2024-10-10: 1000 mg via ORAL
  Filled 2024-10-10: qty 2

## 2024-10-10 NOTE — ED Triage Notes (Signed)
 Pt c/o suprapubic pain, worse on L x 2 days. +nausea. Denies fever. + urinary frquency  Hx of ovarian cysts. Took 2 pamprin appx 2 hr pta, no relief.

## 2024-10-10 NOTE — Discharge Instructions (Addendum)
 As we discussed there was no apparent origin of your left lower quadrant pain seen on ultrasound today.  Your pain may be gastrointestinal in nature and should resolve with time however, if pain worsens, or you develop blood in stool, fevers, chest pain, shortness of breath, or fainting spells, or any other concerning symptoms, please return for further evaluation.

## 2024-10-10 NOTE — ED Provider Notes (Signed)
 [place] EMERGENCY DEPARTMENT AT [facility] Provider Note   CSN: [id number] Arrival date & time: 10/10/24  1158     Patient presents with: Abdominal Pain   Linda Archer is a 38 y.o. female.   Patient is here for evaluation of left lower quadrant pain.  Patient has a history of ovarian cysts and states the pain feels similarly.  She reports clear vaginal discharge which typically happens when she has ovarian cysts.  The pain started last week and she had had her period the week prior so she figured it was ovulation related pain however, yesterday the pain became more severe.  Pain radiates to her back.  Pain is worse with movement.  Pain actually improved with CVA tenderness exam.  She has had transient nausea secondary to the pain.  Denies vomiting, fevers, dizziness, or syncope.  She denies vaginal bleeding, hematuria, dysuria, change in bowel habits, blood in stool.  She took Pamprin yesterday and today with minimal improvement.  She called her OB/GYN who was only able to get her in for follow-up in January or February.  History of salpingectomy.  The history is provided by the patient.  Abdominal Pain Pain location:  LLQ      Prior to Admission medications   Medication Sig Start Date End Date Taking? Authorizing Provider  albuterol (PROVENTIL HFA;VENTOLIN HFA) 108 (90 Base) MCG/ACT inhaler Inhale 2 puffs into the lungs every 6 (six) hours as needed for wheezing or shortness of breath.    [provider]  albuterol (PROVENTIL) (5 MG/ML) 0.5% nebulizer solution Inhale by nebulization. Dilute 0.6ml=1.25mg  to final volume of 3ml for inhalation every 3-4 hours as needed for wheezing    [provider]  citalopram (CELEXA) 40 MG tablet Take 40 mg by mouth daily.    [provider]  dicyclomine  (BENTYL ) 20 MG tablet Take 1 tablet (20 mg total) by mouth 2 (two) times daily. 08/27/20   Annabell Vinie DASEN, PA-C  etonogestrel (NEXPLANON) 68 MG IMPL  implant by Subdermal route.    [provider]  Fluticasone Furoate-Vilanterol (BREO ELLIPTA IN) Inhale into the lungs.    [provider]  HYDROcodone -acetaminophen  (NORCO/VICODIN) 5-325 MG tablet Take 1 tablet by mouth every 6 (six) hours as needed. 07/06/18   Midge Golas, MD  Montelukast Sodium (SINGULAIR PO) Take by mouth.    [provider]  ondansetron  (ZOFRAN -ODT) 8 MG disintegrating tablet Take 1 tablet (8 mg total) by mouth every 8 (eight) hours as needed. 01/06/22   Emelia Sluder, PA-C    Allergies: Latex, Codeine , and Escitalopram    Review of Systems  Gastrointestinal:  Positive for abdominal pain.    Updated Vital Signs BP (!) 138/104   Pulse 81   Temp 98.4 F (36.9 C)   Resp 16   Ht 5' (1.524 m)   Wt 68 kg   LMP 10/03/2024 (Approximate)   SpO2 100%   BMI 29.29 kg/m   Physical Exam Vitals and nursing note reviewed. Exam conducted with a chaperone present.  Constitutional:      General: She is not in acute distress.    Appearance: She is well-developed. She is not ill-appearing or diaphoretic.  HENT:     Head: Normocephalic and atraumatic.  Cardiovascular:     Rate and Rhythm: Normal rate and regular rhythm.     Heart sounds: Normal heart sounds.  Pulmonary:     Effort: Pulmonary effort is normal. No respiratory distress.     Breath sounds:  Normal breath sounds. No stridor. No wheezing, rhonchi or rales.  Abdominal:     General: Abdomen is flat. Bowel sounds are normal. There is no distension.     Palpations: Abdomen is soft.     Tenderness: There is abdominal tenderness in the left lower quadrant. There is guarding. There is no right CVA tenderness or left CVA tenderness. Negative signs include Murphy's sign, Rovsing's sign and McBurney's sign.  Genitourinary:    General: Normal vulva.     Exam position: Lithotomy position.     Pubic Area: No rash or pubic lice.      Labia:        Right: No rash, tenderness, lesion or injury.         Left: No rash, tenderness, lesion or injury.      Urethra: No prolapse, urethral pain, urethral swelling or urethral lesion.     Vagina: Normal.     Cervix: Discharge (Clear, mucus-like) present. No cervical motion tenderness, friability, lesion, erythema, cervical bleeding or eversion.     Uterus: Normal.      Adnexa:        Right: No tenderness.         Left: Tenderness present.      Comments: No cervical motion tenderness. Musculoskeletal:     Lumbar back: Tenderness present.     Comments: Radiation of left lower quadrant pain to the low back.  Patient actually notes improvement of pain with CVA tenderness exam.  Skin:    General: Skin is warm and dry.     Coloration: Skin is not cyanotic, jaundiced or pale.  Neurological:     Mental Status: She is alert and oriented to person, place, and time.     (all labs ordered are listed, but only abnormal results are displayed) Labs Reviewed  CBC - Abnormal; Notable for the following components:      Result Value   RBC 5.19 (*)    All other components within normal limits  WET PREP, GENITAL  LIPASE, BLOOD  COMPREHENSIVE METABOLIC PANEL WITH GFR  URINALYSIS, ROUTINE W REFLEX MICROSCOPIC  PREGNANCY, URINE  GC/CHLAMYDIA PROBE AMP ([place]) NOT AT [facility]    EKG: None  Radiology: US  PELVIC COMPLETE W TRANSVAGINAL AND TORSION R/O Result Date: 10/10/2024 EXAM: US  Pelvis, Complete Transvaginal and Transabdominal with Doppler 10/10/2024 03:27:25 PM TECHNIQUE: Transabdominal and transvaginal pelvic duplex ultrasound using B-mode/gray scaled imaging with Doppler spectral analysis and color flow was obtained. COMPARISON: US  Abdomen 08/27/2020. CT of 08/27/2020 CLINICAL HISTORY: Pelvic pain, complains of left adnexal pain. FINDINGS: UTERUS: Uterus measures 11.7 x 4.2 x 6.8 cm. Uterus demonstrates normal myometrial echotexture. ENDOMETRIAL STRIPE: Endometrial stripe measures 0.7 mm. Endometrial stripe is within normal limits. RIGHT OVARY:  right ovarian peripherally hypervascular cystic lesion, most consistent with a corpus luteum cyst, measuring 2.4 x 1.8 x 2.0 cm. There is normal arterial and venous Doppler flow. LEFT OVARY: Left ovary measures 2.4 x 1.8 x 1.5 cm. Left ovary is within normal limits. There is normal arterial and venous Doppler flow. FREE FLUID: Trace free pelvic fluid is likely physiologic IMPRESSION: 1. Right ovarian cystic lesion is most likely a corpus luteum cyst. 2.  No explanation for left sided pain . Electronically signed by: Rockey Kilts MD 10/10/2024 04:21 PM EST RP Workstation: [id number]     .Pelvic exam  Date/Time: 10/10/2024 3:27 PM  Performed by: Rosina Almarie LABOR, PA-C Authorized by: Rosina Almarie LABOR, PA-C  Consent: Verbal consent obtained Risks and benefits: risks,  benefits and alternatives were discussed Consent given by: patient Patient understanding: patient states understanding of the procedure being performed Patient consent: the patient's understanding of the procedure matches consent given Procedure consent: procedure consent matches procedure scheduled Relevant documents: relevant documents present and verified Test results: test results available and properly labeled Patient identity confirmed: verbally with patient, provided demographic data and hospital-assigned identification number Time out: Immediately prior to procedure a time out was called to verify the correct patient, procedure, equipment, support staff and site/side marked as required. Preparation: Patient was prepped and draped in the usual sterile fashion. Local anesthesia used: no  Anesthesia: Local anesthesia used: no  Sedation: Patient sedated: no  Patient tolerance: patient tolerated the procedure well with no immediate complications      Medications Ordered in the ED  acetaminophen  (TYLENOL ) tablet 1,000 mg (1,000 mg Oral Given 10/10/24 1519)    Patient presents to the ED for concern of left lower  quadrant abdominal pain, this involves an extensive number of treatment options, and is a complaint that carries with it a high risk of complications and morbidity.  The differential diagnosis includes ectopic pregnancy, diverticulitis, STI, ovarian torsion, ovarian cyst, gastroenteritis.  History of salpingectomy and negative pregnancy test ruling out ectopic pregnancy.   Lab Tests:  I Ordered, and personally interpreted labs.  The pertinent results include: Unremarkable blood work and urinalysis.  Negative wet prep.  Gonorrhea chlamydia pending.   Imaging Studies ordered:  I ordered imaging studies including pelvic ultrasound I independently visualized and interpreted imaging which showed no acute findings to explain left lower quadrant abdominal pain. I agree with the radiologist interpretation   Medicines ordered and prescription drug management:  I ordered medication including Tylenol  for pain management.  Patient was offered Dilaudid  or Toradol  however she declined these medications as she is driving and does not want to feel like she cannot drive.  She also declined Zofran  at this time as she is not currently nauseated. Reevaluation of the patient after these medicines showed that the patient improved I have reviewed the patients home medicines and have made adjustments as needed   Problem List / ED Course:  Patient is here for evaluation of left lower quadrant abdominal pain.  Lab work and imaging performed.  Underwhelming ultrasound and pelvic exam.  Patient reports improvement of pain with Tylenol .  Shared decision making with patient to defer CT abdomen pelvis at this time and instead monitor symptoms in the home setting.  She will return if symptoms persist or worsen and imaging can be done at that time. With the consideration that left lower quadrant pain may be diverticulitis or gastroenteritis in nature.  Both of which would be treated with supportive care versus  prescription treatment.  I feel that shared decision making with patient regarding whether to pursue additional imaging is warranted.   Reevaluation:  After the interventions noted above, I reevaluated the patient and found that they have :improved   Dispostion:  After consideration of the diagnostic results and the patients response to treatment, I feel that the patent would benefit from continued monitoring of symptoms in the home setting.  Return precautions given.   Medical Decision Making Amount and/or Complexity of Data Reviewed Labs: ordered. Radiology: ordered.  Risk OTC drugs.     Final diagnoses:  Gastroenteritis    ED Discharge Orders     None          Indiyah Paone A, PA-C 10/10/24 2306    Bernard,  Franky, MD 10/11/24 [phone]

## 2024-10-13 LAB — GC/CHLAMYDIA PROBE AMP (~~LOC~~) NOT AT ARMC
Chlamydia: NEGATIVE
Comment: NEGATIVE
Comment: NORMAL
Neisseria Gonorrhea: NEGATIVE
